# Patient Record
Sex: Male | Born: 1975 | Race: White | Hispanic: No | Marital: Married | Smoking: Never smoker
Health system: Southern US, Community
[De-identification: ages and names within clinical notes are randomized; demographics above are authoritative.]

## PROBLEM LIST (undated history)

## (undated) DIAGNOSIS — M545 Low back pain, unspecified: Secondary | ICD-10-CM

## (undated) DIAGNOSIS — R7989 Other specified abnormal findings of blood chemistry: Secondary | ICD-10-CM

## (undated) DIAGNOSIS — K219 Gastro-esophageal reflux disease without esophagitis: Secondary | ICD-10-CM

## (undated) DIAGNOSIS — A6920 Lyme disease, unspecified: Secondary | ICD-10-CM

## (undated) HISTORY — DX: Other specified abnormal findings of blood chemistry: R79.89

## (undated) HISTORY — DX: Lyme disease, unspecified: A69.20

## (undated) HISTORY — DX: Low back pain: M54.5

## (undated) HISTORY — DX: Low back pain, unspecified: M54.50

## (undated) HISTORY — PX: ANKLE SURGERY: SHX546

---

## 2005-09-16 ENCOUNTER — Emergency Department: Payer: Self-pay | Admitting: Emergency Medicine

## 2006-03-26 ENCOUNTER — Encounter: Admission: RE | Admit: 2006-03-26 | Discharge: 2006-03-26 | Payer: Self-pay | Admitting: Orthopedic Surgery

## 2007-05-04 ENCOUNTER — Ambulatory Visit (HOSPITAL_COMMUNITY): Admission: RE | Admit: 2007-05-04 | Discharge: 2007-05-05 | Payer: Self-pay | Admitting: Orthopedic Surgery

## 2008-03-20 LAB — HM DEXA SCAN: HM Dexa Scan: NORMAL

## 2010-07-22 NOTE — Op Note (Signed)
NAMECANTRELL, LAROUCHE                ACCOUNT NO.:  [id number]   MEDICAL RECORD NO.:  [id number]          PATIENT TYPE:  OIB   LOCATION:  5018                         FACILITY:  [facility]   PHYSICIAN:  Nadara Mustard, MD     DATE OF BIRTH:  [birth year]   DATE OF PROCEDURE:  05/04/2007  DATE OF DISCHARGE:                               OPERATIVE REPORT   PREOPERATIVE DIAGNOSIS:  Subtalar arthritis, right foot, with congenital  calcaneonavicular bar.   POSTOPERATIVE DIAGNOSIS:  Subtalar arthritis, right foot, with  congenital calcaneonavicular bar.   PROCEDURE:  Right foot triple arthrodesis.   SURGEON:  Nadara Mustard, M.D.   ANESTHESIA:  General plus popliteal block.   ESTIMATED BLOOD LOSS:  Minimal.   ANTIBIOTICS:  1 gram of Kefzol.   DRAINS:  None.   COMPLICATIONS:  None.   TOURNIQUET TIME:  Esmarch at the ankle for approximately 56 minutes.   DISPOSITION:  To PACU in stable condition.   INDICATIONS FOR PROCEDURE:  The patient is a 35 year old gentleman with  a congenital subtalar bar.  He underwent subtalar bar resection;  however, the patient had developed significant arthritis in the  talonavicular, calcaneocuboid, and subtalar joint due to his persistent  arthritis.  The patient had pain with activities of daily living and  presents at this time for triple arthrodesis.  CT scan confirmed the  degenerative changes of the talonavicular, calcaneocuboid, and subtalar  joint.  The risks and benefits were as discussed including infection,  neurovascular, persistent pain, need additional surgery.  The patient  states he understands and wished to proceed at this time.   DESCRIPTION OF PROCEDURE:  The patient was brought to OR room 10 after  undergoing a popliteal block.  The patient then underwent a general  anesthetic.  After an adequate level of his anesthesia was obtained, the  patient's right lower extremity was prepped using DuraPrep and draped in  a sterile field.  Collier Flowers  was used to cover all exposed skin.   The previous incision over the sinus tarsi was used.  This was extended  down through the soft tissue.  Fibrous tissue from the subtalar joint  was excised.  The patient underwent excision of the articular cartilage  from the posterior facet of the subtalar joint, within the subtalar  joint, as well as the calcaneocuboid joint.  After debridement,  attention was then focused to the talonavicular joint.  An incision was  made dorsally.  Blunt dissection was carried down.  The vascular bundles  were protected, and the talonavicular joint was also debrided of bone  spurs and articular cartilage.  The foot was reduced with the foot held  in slight valgus and the foot and 90 degrees of ankle dorsiflexion.  A  guidewire was inserted from the talar neck into the calcaneus.  This was  over-drilled, and a 80-mm 7.3 cannulated screw was placed.  Two 3.5-mm  cortical screws were then placed in a crossing fashion to stabilize the  calcaneocuboid joint, and a separate screw 3.5 mm in diameter was placed  across the talonavicular joint.  C-arm fluoroscopy verified reduction in  both AP and lateral planes.  The wounds were irrigated with normal  saline throughout the case.  10 mL of Vitoss bone graft was placed in  the subtalar joint.  The wound was again irrigated.  The extensor brevis  muscle was closed over the sinus tarsi area.  This was again irrigated.  The skin  incisions were closed using a modified vertical mattress  suture with 2-0 nylon.  The wounds were covered with Adaptic orthopedic  sponges, ABD dressing, Kerlix, and Coban.   The patient was extubated and taken to the PACU in stable condition.      Nadara Mustard, MD  Electronically Signed     MVD/MEDQ  D:  05/04/2007  T:  05/05/2007  Job:  [id number]

## 2010-11-28 LAB — CBC
HCT: 44.9
MCHC: 34
RBC: 5.4
RDW: 12.7

## 2013-05-15 ENCOUNTER — Ambulatory Visit: Payer: Self-pay | Admitting: Podiatry

## 2013-05-18 ENCOUNTER — Ambulatory Visit (INDEPENDENT_AMBULATORY_CARE_PROVIDER_SITE_OTHER): Payer: 59 | Admitting: Podiatrist

## 2013-05-18 ENCOUNTER — Ambulatory Visit (INDEPENDENT_AMBULATORY_CARE_PROVIDER_SITE_OTHER): Payer: 59

## 2013-05-18 ENCOUNTER — Encounter: Payer: Self-pay | Admitting: Podiatrist

## 2013-05-18 VITALS — BP 125/79 | HR 62 | Resp 16 | Ht 70.0 in | Wt 255.0 lb

## 2013-05-18 DIAGNOSIS — M722 Plantar fascial fibromatosis: Secondary | ICD-10-CM

## 2013-05-18 DIAGNOSIS — M79609 Pain in unspecified limb: Secondary | ICD-10-CM

## 2013-05-18 DIAGNOSIS — M79673 Pain in unspecified foot: Secondary | ICD-10-CM

## 2013-05-18 MED ORDER — TRIAMCINOLONE ACETONIDE 40 MG/ML IJ SUSP: 20.0000 mg | Freq: Once | INTRAMUSCULAR | Status: DC

## 2013-05-18 MED ORDER — MELOXICAM 15 MG PO TABS: 15.0000 mg | ORAL_TABLET | Freq: Every day | ORAL | Status: DC

## 2013-05-18 NOTE — Progress Notes (Signed)
   Subjective:    Patient ID: Brandis Matsuura, male    DOB: Sep 02, 1975, 38 y.o.   MRN: [id number]  HPI Comments: N tender L left heel D jan 2015 O sudden C same A walking, standing T ibuprofen  Foot Pain      Review of Systems  Constitutional: Negative.   HENT:       Ringing in ears  Eyes: Negative.   Respiratory: Negative.   Cardiovascular: Negative.   Gastrointestinal: Negative.   Endocrine: Negative.   Genitourinary: Negative.   Musculoskeletal:       Joint pain  Difficulty walking   Skin: Negative.   Allergic/Immunologic: Negative.   Neurological: Negative.   Hematological: Negative.   Psychiatric/Behavioral: Negative.        Objective:   Physical Exam  GENERAL APPEARANCE: Alert, conversant. Appropriately groomed. No acute distress.  VASCULAR: Pedal pulses palpable and strong bilateral.  Capillary refill time is immediate to all digits,  Proximal to distal cooling it warm to warm.  Digital hair growth is present bilateral  NEUROLOGIC: sensation is intact epicritically and protectively to 5.07 monofilament at 5/5 sites bilateral.  Light touch is intact bilateral, vibratory sensation intact bilateral, achilles tendon reflex is intact bilateral.  Negative Tinel sign is elicited MUSCULOSKELETAL: Pain on palpation plantar medial aspect left foot  at insertion of plantar fascia on the medial calcaneal tubercle. Inflammation at the insertion of the plantar fascia is present. Pes planus foot type is seen bilateral.  Subtalar joint right foot has been previously fused courtesy of Dr. Sharol Given.  He is having no problem DERMATOLOGIC: peeling skin of the bilateral feet is noted.  Area of thickness on bilateral heels also present.       Assessment & Plan:  Plantar fasciitis left  Plan: A steroid injection was recommended into the left heel and the patient agreed.  As was carried out under sterile technique with Kenalog and Marcaine plain. A plantar fascial brace was dispensed as  well as a prescription for meloxicam. I will see him back in 4 weeks for followup. He will call if he has problems prior that visit.

## 2013-05-18 NOTE — Patient Instructions (Signed)

## 2013-05-18 NOTE — Progress Notes (Deleted)
Plantar fasciitis left, inject, mobic, plantar fascia brace back in 4 weeks duda fused right subtalar joint

## 2013-06-15 ENCOUNTER — Ambulatory Visit (INDEPENDENT_AMBULATORY_CARE_PROVIDER_SITE_OTHER): Payer: 59 | Admitting: Podiatrist

## 2013-06-15 VITALS — Resp 16 | Ht 70.0 in | Wt 245.0 lb

## 2013-06-15 DIAGNOSIS — M722 Plantar fascial fibromatosis: Secondary | ICD-10-CM

## 2013-06-15 NOTE — Patient Instructions (Signed)

## 2013-06-20 NOTE — Progress Notes (Signed)
Chief Complaint  Patient presents with  . Follow-up    its alright, it still hurts. the brace does help     HPI: Patient is 38 y.o. male who presents today for followup of plantar fasciitis. He states that the foot feels okay and that the brace is helping.  Physical examination is unchanged  APPEARANCE: Alert, conversant. Appropriately groomed. No acute distress.  VASCULAR: Pedal pulses palpable and strong bilateral. Capillary refill time is immediate to all digits, Proximal to distal cooling it warm to warm. Digital hair growth is present bilateral  NEUROLOGIC: sensation is intact epicritically and protectively to 5.07 monofilament at 5/5 sites bilateral. Light touch is intact bilateral, vibratory sensation intact bilateral, achilles tendon reflex is intact bilateral. Negative Tinel sign is elicited  MUSCULOSKELETAL: Pain on palpation plantar medial aspect left foot at insertion of plantar fascia on the medial calcaneal tubercle. Inflammation at the insertion of the plantar fascia is present. Pes planus foot type is seen bilateral. Subtalar joint right foot has been previously fused courtesy of Dr. Sharol Given. He is having no problem in this area DERMATOLOGIC: peeling skin of the bilateral feet is noted. Area of thickness on bilateral heels also present.   Assessment: Plantar fasciitis left  Plan: A second injection was carried out under sterile technique at today's visit. Recommended continued stretching exercises as well as good supportive shoes and we discussed inserts as well. I'll see him back for recheck.

## 2013-07-06 ENCOUNTER — Ambulatory Visit: Payer: 59 | Admitting: Podiatrist

## 2013-07-12 ENCOUNTER — Ambulatory Visit: Payer: Self-pay | Admitting: Family Medicine

## 2013-07-20 ENCOUNTER — Ambulatory Visit (INDEPENDENT_AMBULATORY_CARE_PROVIDER_SITE_OTHER): Payer: 59 | Admitting: Podiatrist

## 2013-07-20 VITALS — BP 127/73 | HR 69 | Resp 16

## 2013-07-20 DIAGNOSIS — M722 Plantar fascial fibromatosis: Secondary | ICD-10-CM

## 2013-07-20 NOTE — Progress Notes (Signed)
HPI: Patient is 38 y.o. male who presents today for followup of plantar fasciitis. He states that the foot feels okay and he has been using some otc inserts that are helping.  He relates the brace has started to hurt the foot but overall states the pain is slowly improving.  Physical examination is unchanged  APPEARANCE: Alert, conversant. Appropriately groomed. No acute distress.  VASCULAR: Pedal pulses palpable and strong bilateral. Capillary refill time is immediate to all digits, Proximal to distal cooling it warm to warm. Digital hair growth is present bilateral  NEUROLOGIC: sensation is intact epicritically and protectively to 5.07 monofilament at 5/5 sites bilateral. Light touch is intact bilateral, vibratory sensation intact bilateral, achilles tendon reflex is intact bilateral. Negative Tinel sign is elicited  MUSCULOSKELETAL: Pain on palpation plantar medial aspect left foot at insertion of plantar fascia on the medial calcaneal tubercle. Decreased inflammation at the insertion of the plantar fascia is noted. Pes planus foot type is seen bilateral. Subtalar joint right foot has been previously fused courtesy of Dr. Sharol Given. He is having no problem in this area  DERMATOLOGIC: peeling skin of the bilateral feet is noted. Area of thickness on bilateral heels also present.   Assessment: Plantar fasciitis left  Plan:  Discussed orthotics as the next step in treatment. He would like to try some over-the-counter orthotics first and therefore I recommended the power step inserts. Recommended continuing his stretching, wearing his good knee about shoes, and anti-inflammatory use when necessary. We did not inject the heel at today's visit. If the over-the-counter inserts are not beneficial we'll consider custom. He will call if he would like to consider these.

## 2013-07-20 NOTE — Patient Instructions (Signed)

## 2014-05-14 ENCOUNTER — Encounter: Payer: Self-pay | Admitting: Gastroenterology

## 2014-06-26 ENCOUNTER — Ambulatory Visit: Payer: Self-pay | Admitting: Gastroenterology

## 2014-09-19 ENCOUNTER — Ambulatory Visit (INDEPENDENT_AMBULATORY_CARE_PROVIDER_SITE_OTHER): Payer: 59 | Admitting: Family Medicine

## 2014-09-19 ENCOUNTER — Encounter: Payer: Self-pay | Admitting: Family Medicine

## 2014-09-19 ENCOUNTER — Other Ambulatory Visit: Payer: Self-pay | Admitting: Family Medicine

## 2014-09-19 VITALS — BP 122/84 | HR 74 | Temp 98.3°F | Resp 18 | Ht 70.0 in | Wt 249.5 lb

## 2014-09-19 DIAGNOSIS — G4733 Obstructive sleep apnea (adult) (pediatric): Secondary | ICD-10-CM | POA: Diagnosis not present

## 2014-09-19 DIAGNOSIS — E66811 Obesity, class 1: Secondary | ICD-10-CM | POA: Insufficient documentation

## 2014-09-19 DIAGNOSIS — Z0000 Encounter for general adult medical examination without abnormal findings: Secondary | ICD-10-CM | POA: Diagnosis not present

## 2014-09-19 DIAGNOSIS — E669 Obesity, unspecified: Secondary | ICD-10-CM | POA: Diagnosis not present

## 2014-09-19 DIAGNOSIS — E291 Testicular hypofunction: Secondary | ICD-10-CM | POA: Insufficient documentation

## 2014-09-19 DIAGNOSIS — E039 Hypothyroidism, unspecified: Secondary | ICD-10-CM | POA: Insufficient documentation

## 2014-09-19 NOTE — Progress Notes (Signed)
Name: Cameron Hood   MRN: [id number]    DOB: 26-Oct-1975   Date:09/19/2014       Progress Note  Subjective  Chief Complaint  Chief Complaint  Patient presents with  . Annual Exam    HPI   39 year old male presents for annual  H&P.  Obstructive sleep apnea  Patient has recurring and worsening history of nocturnal snoring as well as awakening from sleep gasping at times. His wife has noted snoring as well. He often awakens with headache and often finds himself nodding when sitting for long periods of time. There's also been a problem with peripheral edema weight gain and hypertension   Hypogonadism  Patient is currently under the care of a urologist and is on AndroGel 4 pumps daily. He states this lasts total testosterone was 200 range but his free testosterone was in the normal range.    Past Medical History  Diagnosis Date  . Low testosterone     History  Substance Use Topics  . Smoking status: Never Smoker   . Smokeless tobacco: Not on file  . Alcohol Use: 0.0 oz/week    0 Standard drinks or equivalent per week     Current outpatient prescriptions:  .  Testosterone (ANDROGEL PUMP) 20.25 MG/ACT (1.62%) GEL, Place onto the skin daily., Disp: , Rfl:   Current facility-administered medications:  .  triamcinolone acetonide (KENALOG-40) injection 20 mg, 20 mg, Other, Once, Bronson Ing, DPM  Allergies  Allergen Reactions  . Mobic [Meloxicam] Other (See Comments)    Heart race    Review of Systems  Constitutional: Positive for malaise/fatigue. Negative for fever, chills and weight loss.       Obesity  HENT: Negative for congestion, hearing loss, sore throat and tinnitus.        Snoring  Eyes: Negative for blurred vision, double vision and redness.  Respiratory: Positive for shortness of breath. Negative for cough and hemoptysis.        Sometimes awakens at night short of breath and gasping. He also has daytime somnolence and headache in the a.m.   Cardiovascular: Negative for chest pain, palpitations, orthopnea, claudication and leg swelling.  Gastrointestinal: Negative for heartburn, nausea, vomiting, diarrhea, constipation and blood in stool.  Genitourinary: Negative for dysuria, urgency, frequency and hematuria.       Hypogonadism Decreased libido  Musculoskeletal: Negative for myalgias, back pain, joint pain, falls and neck pain.  Skin: Negative for itching.  Neurological: Negative for dizziness, tingling, tremors, focal weakness, seizures, loss of consciousness, weakness and headaches.  Endo/Heme/Allergies: Does not bruise/bleed easily.  Psychiatric/Behavioral: Negative for depression and substance abuse. The patient has insomnia. The patient is not nervous/anxious.      Objective  Filed Vitals:   09/19/14 0952  BP: 122/84  Pulse: 74  Temp: 98.3 F (36.8 C)  TempSrc: Oral  Resp: 18  Height: 5' 10"  (1.778 m)  Weight: 249 lb 8 oz (113.172 kg)  SpO2: 98%     Physical Exam  Constitutional: He is oriented to person, place, and time and well-developed, well-nourished, and in no distress.  Obese  HENT:  Head: Normocephalic.  Eyes: EOM are normal. Pupils are equal, round, and reactive to light.  Neck: Normal range of motion. Neck supple. No thyromegaly present.  Cardiovascular: Normal rate, regular rhythm and normal heart sounds.   No murmur heard. Pulmonary/Chest: Effort normal and breath sounds normal. No respiratory distress. He has no wheezes.  Abdominal: Soft. Bowel sounds are normal.  Genitourinary: Discharge  found.  Musculoskeletal: Normal range of motion. He exhibits no edema.  Lymphadenopathy:    He has no cervical adenopathy.  Neurological: He is alert and oriented to person, place, and time. No cranial nerve deficit. Gait normal. Coordination normal.  Skin: Skin is warm and dry. No rash noted.  Psychiatric: Affect and judgment normal.      Assessment & Plan  1. Annual physical exam  -  Comprehensive metabolic panel - Lipid panel - CBC with Differential/Platelet - POC Hemoccult Bld/Stl (3-Cd Home Screen); Future  2. OSA (obstructive sleep apnea)  - Ambulatory referral to Sleep Studies  3. Obesity  h/o  4. Hypogonadism in male Per endo

## 2014-09-19 NOTE — Patient Instructions (Signed)
Obesity Obesity is defined as having too much total body fat and a body mass index (BMI) of 30 or more. BMI is an estimate of body fat and is calculated from your height and weight. Obesity happens when you consume more calories than you can burn by exercising or performing daily physical tasks. Prolonged obesity can cause major illnesses or emergencies, such as:   Stroke.  Heart disease.  Diabetes.  Cancer.  Arthritis.  High blood pressure (hypertension).  High cholesterol.  Sleep apnea.  Erectile dysfunction.  Infertility problems. CAUSES   Regularly eating unhealthy foods.  Physical inactivity.  Certain disorders, such as an underactive thyroid (hypothyroidism), Cushing's syndrome, and polycystic ovarian syndrome.  Certain medicines, such as steroids, some depression medicines, and antipsychotics.  Genetics.  Lack of sleep. DIAGNOSIS  A health care provider can diagnose obesity after calculating your BMI. Obesity will be diagnosed if your BMI is 30 or higher.  There are other methods of measuring obesity levels. Some other methods include measuring your skinfold thickness, your waist circumference, and comparing your hip circumference to your waist circumference. TREATMENT  A healthy treatment program includes some or all of the following:  Long-term dietary changes.  Exercise and physical activity.  Behavioral and lifestyle changes.  Medicine only under the supervision of your health care provider. Medicines may help, but only if they are used with diet and exercise programs. An unhealthy treatment program includes:  Fasting.  Fad diets.  Supplements and drugs. These choices do not succeed in long-term weight control.  HOME CARE INSTRUCTIONS   Exercise and perform physical activity as directed by your health care provider. To increase physical activity, try the following:  Use stairs instead of elevators.  Park farther away from store  entrances.  Garden, bike, or walk instead of watching television or using the computer.  Eat healthy, low-calorie foods and drinks on a regular basis. Eat more fruits and vegetables. Use low-calorie cookbooks or take healthy cooking classes.  Limit fast food, sweets, and processed snack foods.  Eat smaller portions.  Keep a daily journal of everything you eat. There are many free websites to help you with this. It may be helpful to measure your foods so you can determine if you are eating the correct portion sizes.  Avoid drinking alcohol. Drink more water and drinks without calories.  Take vitamins and supplements only as recommended by your health care provider.  Weight-loss support groups, Tax adviser, counselors, and stress reduction education can also be very helpful. SEEK IMMEDIATE MEDICAL CARE IF:  You have chest pain or tightness.  You have trouble breathing or feel short of breath.  You have weakness or leg numbness.  You feel confused or have trouble talking.  You have sudden changes in your vision. MAKE SURE YOU:  Understand these instructions.  Will watch your condition.  Will get help right away if you are not doing well or get worse. Document Released: 04/02/2004 Document Revised: 07/10/2013 Document Reviewed: 04/01/2011 [facility] Patient Information 2015 [place], [place]. This information is not intended to replace advice given to you by your health care provider. Make sure you discuss any questions you have with your health care provider.

## 2014-09-20 LAB — CBC WITH DIFFERENTIAL/PLATELET
Basophils Absolute: 0.1 10*3/uL (ref 0.0–0.2)
Basos: 1 %
EOS (ABSOLUTE): 0.1 10*3/uL (ref 0.0–0.4)
EOS: 2 %
HEMATOCRIT: 46.7 % (ref 37.5–51.0)
Hemoglobin: 15.9 g/dL (ref 12.6–17.7)
Immature Grans (Abs): 0 10*3/uL (ref 0.0–0.1)
Immature Granulocytes: 0 %
LYMPHS ABS: 1.5 10*3/uL (ref 0.7–3.1)
LYMPHS: 26 %
MCH: 28.4 pg (ref 26.6–33.0)
MCHC: 34 g/dL (ref 31.5–35.7)
MCV: 84 fL (ref 79–97)
MONOCYTES: 8 %
Monocytes Absolute: 0.4 10*3/uL (ref 0.1–0.9)
Neutrophils Absolute: 3.7 10*3/uL (ref 1.4–7.0)
Neutrophils: 63 %
PLATELETS: 287 10*3/uL (ref 150–379)
RBC: 5.59 x10E6/uL (ref 4.14–5.80)
RDW: 14.1 % (ref 12.3–15.4)
WBC: 5.8 10*3/uL (ref 3.4–10.8)

## 2014-09-20 LAB — COMPREHENSIVE METABOLIC PANEL
ALBUMIN: 4.3 g/dL (ref 3.5–5.5)
ALT: 20 IU/L (ref 0–44)
AST: 21 IU/L (ref 0–40)
Albumin/Globulin Ratio: 1.7 (ref 1.1–2.5)
Alkaline Phosphatase: 84 IU/L (ref 39–117)
BUN/Creatinine Ratio: 16 (ref 9–20)
BUN: 13 mg/dL (ref 6–24)
Bilirubin Total: 0.6 mg/dL (ref 0.0–1.2)
CHLORIDE: 101 mmol/L (ref 97–108)
CO2: 24 mmol/L (ref 18–29)
CREATININE: 0.79 mg/dL (ref 0.76–1.27)
Calcium: 9.3 mg/dL (ref 8.7–10.2)
GFR, EST AFRICAN AMERICAN: 130 mL/min/{1.73_m2} (ref >59)
GFR, EST NON AFRICAN AMERICAN: 112 mL/min/{1.73_m2} (ref >59)
GLOBULIN, TOTAL: 2.5 g/dL (ref 1.5–4.5)
Glucose: 93 mg/dL (ref 65–99)
Potassium: 4.4 mmol/L (ref 3.5–5.2)
SODIUM: 140 mmol/L (ref 134–144)
TOTAL PROTEIN: 6.8 g/dL (ref 6.0–8.5)

## 2014-09-20 LAB — LIPID PANEL
CHOL/HDL RATIO: 3.9 ratio (ref 0.0–5.0)
CHOLESTEROL TOTAL: 138 mg/dL (ref 100–199)
HDL: 35 mg/dL — AB (ref >39)
LDL Calculated: 74 mg/dL (ref 0–99)
TRIGLYCERIDES: 145 mg/dL (ref 0–149)
VLDL CHOLESTEROL CAL: 29 mg/dL (ref 5–40)

## 2014-10-31 ENCOUNTER — Encounter: Payer: Self-pay | Admitting: Family Medicine

## 2014-10-31 ENCOUNTER — Ambulatory Visit (INDEPENDENT_AMBULATORY_CARE_PROVIDER_SITE_OTHER): Payer: 59 | Admitting: Family Medicine

## 2014-10-31 VITALS — BP 132/80 | HR 85 | Temp 99.8°F | Resp 18 | Ht 70.0 in | Wt 245.9 lb

## 2014-10-31 DIAGNOSIS — Z8619 Personal history of other infectious and parasitic diseases: Secondary | ICD-10-CM

## 2014-10-31 DIAGNOSIS — N410 Acute prostatitis: Secondary | ICD-10-CM | POA: Diagnosis not present

## 2014-10-31 DIAGNOSIS — R509 Fever, unspecified: Secondary | ICD-10-CM

## 2014-10-31 DIAGNOSIS — A6920 Lyme disease, unspecified: Secondary | ICD-10-CM | POA: Diagnosis not present

## 2014-10-31 HISTORY — DX: Personal history of other infectious and parasitic diseases: Z86.19

## 2014-10-31 LAB — POCT URINALYSIS DIPSTICK
Bilirubin, UA: NEGATIVE
Glucose, UA: NEGATIVE
KETONES UA: NEGATIVE
LEUKOCYTES UA: NEGATIVE
Nitrite, UA: NEGATIVE
PH UA: 5
SPEC GRAV UA: 1.02
Urobilinogen, UA: 0.2

## 2014-10-31 LAB — POCT RAPID INFLUENZA A&B: SALIVARY CORTISOL BASELINE: NEGATIVE

## 2014-10-31 MED ORDER — DOXYCYCLINE HYCLATE 100 MG PO CAPS: 100.0000 mg | ORAL_CAPSULE | Freq: Two times a day (BID) | ORAL | Status: DC

## 2014-10-31 NOTE — Progress Notes (Signed)
Name: Cameron Hood   MRN: [id number]    DOB: 1975-11-25   Date:10/31/2014       Progress Note  Subjective  Chief Complaint  Chief Complaint  Patient presents with  . Fever    102, bodyaches for 4 days    HPI  Fever  Patient presents with a four-day history of fever or chills myalgias. This been no nausea vomiting diarrhea rash. He has had some mild myalgias and joint pains as well as mild headache. There's been no recent known tick bite. However he has a semi-remote history of Lyme's disease he states his symptoms are somewhat similar here at the beginning we did not find a tick bite this time. He is out of doors quite a bit. There are no urinary symptoms currently    Past Medical History  Diagnosis Date  . Low testosterone     Social History  Substance Use Topics  . Smoking status: Never Smoker   . Smokeless tobacco: Not on file  . Alcohol Use: 0.0 oz/week    0 Standard drinks or equivalent per week     Current outpatient prescriptions:  .  Testosterone (ANDROGEL PUMP) 20.25 MG/ACT (1.62%) GEL, Place onto the skin daily., Disp: , Rfl:   Allergies  Allergen Reactions  . Mobic [Meloxicam] Other (See Comments)    Heart race    Review of Systems  Constitutional: Positive for fever, chills and malaise/fatigue. Negative for weight loss.  HENT: Negative for congestion, ear pain, hearing loss, sore throat and tinnitus.   Eyes: Negative for blurred vision, double vision and redness.  Respiratory: Positive for cough. Negative for hemoptysis and shortness of breath.   Cardiovascular: Negative for chest pain, palpitations, orthopnea, claudication and leg swelling.  Gastrointestinal: Negative for heartburn, nausea, vomiting, diarrhea, constipation and blood in stool.  Genitourinary: Positive for dysuria. Negative for urgency, frequency and hematuria.  Musculoskeletal: Positive for myalgias and joint pain. Negative for back pain, falls and neck pain.  Skin: Negative for  itching.  Neurological: Positive for headaches. Negative for dizziness, tingling, tremors, focal weakness, seizures, loss of consciousness and weakness.  Endo/Heme/Allergies: Does not bruise/bleed easily.  Psychiatric/Behavioral: Negative for depression and substance abuse. The patient is not nervous/anxious and does not have insomnia.      Objective  Filed Vitals:   10/31/14 0755  BP: 132/80  Pulse: 85  Temp: 99.8 F (37.7 C)  TempSrc: Oral  Resp: 18  Height: 5' 10"  (1.778 m)  Weight: 245 lb 14.4 oz (111.54 kg)  SpO2: 97%     Physical Exam  Constitutional: He is oriented to person, place, and time and well-developed, well-nourished, and in no distress.  HENT:  Head: Normocephalic.  Eyes: EOM are normal. Pupils are equal, round, and reactive to light.  Neck: Normal range of motion. Neck supple. No thyromegaly present.  Cardiovascular: Normal rate, regular rhythm and normal heart sounds.   No murmur heard. Pulmonary/Chest: Effort normal and breath sounds normal. No respiratory distress. He has no wheezes.  Abdominal: Soft. Bowel sounds are normal.  Genitourinary:  Prostate is slightly warm and tender to palpation  Musculoskeletal: Normal range of motion. He exhibits no edema.  Lymphadenopathy:    He has no cervical adenopathy.  Neurological: He is alert and oriented to person, place, and time. No cranial nerve deficit. Gait normal. Coordination normal.  Skin: Skin is warm and dry. No rash noted.  Psychiatric: Affect and judgment normal.      Assessment & Plan  1.  Fever, unspecified fever cause Possible causes include mild viral illness, the mild prostatitis noted below, recurrence of Lyme disease  - POCT Rapid Influenza A&B; Standing - POCT urinalysis dipstick - CBC - Comprehensive metabolic panel - TSH  2. Acute prostatitis Mild with hematuria  - doxycycline (VIBRAMYCIN) 100 MG capsule; Take 1 capsule (100 mg total) by mouth 2 (two) times daily.  Dispense: 42  capsule; 3. Lyme disease With history and similar symptomatology Refill: 0  - doxycycline (VIBRAMYCIN) 100 MG capsule; Take 1 capsule (100 mg total) by mouth 2 (two) times daily.  Dispense: 42 capsule; Refill: 0

## 2014-11-01 LAB — COMPREHENSIVE METABOLIC PANEL
ALBUMIN: 4.2 g/dL (ref 3.5–5.5)
ALT: 26 IU/L (ref 0–44)
AST: 22 IU/L (ref 0–40)
Albumin/Globulin Ratio: 1.6 (ref 1.1–2.5)
Alkaline Phosphatase: 82 IU/L (ref 39–117)
BUN / CREAT RATIO: 14 (ref 9–20)
BUN: 13 mg/dL (ref 6–24)
Bilirubin Total: 0.7 mg/dL (ref 0.0–1.2)
CALCIUM: 9.4 mg/dL (ref 8.7–10.2)
CO2: 25 mmol/L (ref 18–29)
CREATININE: 0.93 mg/dL (ref 0.76–1.27)
Chloride: 100 mmol/L (ref 97–108)
GFR, EST AFRICAN AMERICAN: 117 mL/min/{1.73_m2} (ref >59)
GFR, EST NON AFRICAN AMERICAN: 102 mL/min/{1.73_m2} (ref >59)
GLOBULIN, TOTAL: 2.7 g/dL (ref 1.5–4.5)
Glucose: 103 mg/dL — ABNORMAL HIGH (ref 65–99)
Potassium: 4.9 mmol/L (ref 3.5–5.2)
SODIUM: 138 mmol/L (ref 134–144)
Total Protein: 6.9 g/dL (ref 6.0–8.5)

## 2014-11-01 LAB — CBC
HEMATOCRIT: 46.5 % (ref 37.5–51.0)
HEMOGLOBIN: 15.7 g/dL (ref 12.6–17.7)
MCH: 27.7 pg (ref 26.6–33.0)
MCHC: 33.8 g/dL (ref 31.5–35.7)
MCV: 82 fL (ref 79–97)
Platelets: 271 10*3/uL (ref 150–379)
RBC: 5.67 x10E6/uL (ref 4.14–5.80)
RDW: 13.8 % (ref 12.3–15.4)
WBC: 5.9 10*3/uL (ref 3.4–10.8)

## 2014-11-01 LAB — TSH: TSH: 2.11 u[IU]/mL (ref 0.450–4.500)

## 2014-11-21 ENCOUNTER — Ambulatory Visit (INDEPENDENT_AMBULATORY_CARE_PROVIDER_SITE_OTHER): Payer: 59 | Admitting: Family Medicine

## 2014-11-21 ENCOUNTER — Encounter: Payer: Self-pay | Admitting: Family Medicine

## 2014-11-21 VITALS — BP 130/80 | HR 75 | Temp 97.9°F | Resp 18 | Ht 70.0 in | Wt 239.9 lb

## 2014-11-21 DIAGNOSIS — A6920 Lyme disease, unspecified: Secondary | ICD-10-CM

## 2014-11-21 DIAGNOSIS — E669 Obesity, unspecified: Secondary | ICD-10-CM

## 2014-11-21 NOTE — Progress Notes (Signed)
Name: Cameron Hood   MRN: [id number]    DOB: 05-07-1975   Date:11/21/2014       Progress Note  Subjective  Chief Complaint  Chief Complaint  Patient presents with  . OTHER    Discuss BMI on wellness screening  . Lyme Disease    follow up for fever     HPI  Next problem possible Lyme disease were optimized spotted fever  Patient has completed nearly three-week history of Vibramycin. He has fewer problems with fever chills myalgias and the number was rash. There was never a definite history of a tick bite. He has had Lyme disease in the past he states his symptomatology was quite similar.  Obesity patient has metabolic screen obtained from his place of woman. 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  Past Medical History  Diagnosis Date  . Low testosterone     Social History  Substance Use Topics  . Smoking status: Never Smoker   . Smokeless tobacco: Not on file  . Alcohol Use: 0.0 oz/week    0 Standard drinks or equivalent per week     Current outpatient prescriptions:  .  Testosterone (ANDROGEL PUMP) 20.25 MG/ACT (1.62%) GEL, Place onto the skin daily., Disp: , Rfl:   Allergies  Allergen Reactions  . Mobic [Meloxicam] Other (See Comments)    Heart race    Review of Systems  Constitutional: Positive for fever, chills, weight loss and malaise/fatigue.  HENT: Positive for congestion and sore throat.   Respiratory: Negative.   Cardiovascular: Negative.   Gastrointestinal: Negative.   Genitourinary: Negative.   Musculoskeletal: Positive for myalgias and joint pain.  Neurological: Positive for headaches. Negative for focal weakness.     Objective  Filed Vitals:   11/21/14 0740  BP: 130/80  Pulse: 75  Temp: 97.9 F (36.6 C)  TempSrc: Oral  Resp: 18  Height: 5' 10"  (1.778 m)  Weight: 239 lb 14.4 oz (108.818 kg)  SpO2: 98%     Physical Exam   Constitutional: He is oriented to person, place, and time and well-developed, well-nourished, and in no distress.  Obese male who is no acute distress  HENT:  Head: Normocephalic.  Eyes: EOM are normal. Pupils are equal, round, and reactive to light.  Neck: Normal range of motion. Neck supple. No thyromegaly present.  Cardiovascular: Normal rate, regular rhythm and normal heart sounds.   No murmur heard. Pulmonary/Chest: Effort normal and breath sounds normal. No respiratory distress. He has no wheezes.  Musculoskeletal: Normal range of motion. He exhibits no edema.  Lymphadenopathy:    He has no cervical adenopathy.  Neurological: He is alert and oriented to person, place, and time. No cranial nerve deficit. Gait normal. Coordination normal.  Skin: Skin is warm and dry. No rash noted.  Psychiatric: Affect and judgment normal.      Assessment & Plan 1. 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 Obesity Referral - Amb ref to Medical Nutrition Therapy-MNT

## 2014-11-23 ENCOUNTER — Telehealth: Payer: Self-pay | Admitting: Emergency Medicine

## 2014-11-23 LAB — LYME DISEASE, WESTERN BLOT
IGG P39 AB.: ABSENT
IGG P58 AB.: ABSENT
IGG P66 AB.: ABSENT
IGG P93 AB.: ABSENT
IGM P39 AB.: ABSENT
IgG P18 Ab.: ABSENT
IgG P23 Ab.: ABSENT
IgG P28 Ab.: ABSENT
IgG P30 Ab.: ABSENT
IgG P45 Ab.: ABSENT
IgM P41 Ab.: ABSENT
LYME IGM WB: NEGATIVE
Lyme IgG Wb: NEGATIVE

## 2014-11-23 LAB — ROCKY MTN SPOTTED FVR AB, IGG-BLOOD: RMSF IGG: UNDETERMINED

## 2014-11-23 LAB — RMSF, IGG, IFA

## 2014-11-23 NOTE — Telephone Encounter (Signed)
Patient notified

## 2014-11-27 ENCOUNTER — Other Ambulatory Visit: Payer: Self-pay | Admitting: Emergency Medicine

## 2014-11-27 MED ORDER — DOXYCYCLINE HYCLATE 100 MG PO CAPS: 100.0000 mg | ORAL_CAPSULE | Freq: Two times a day (BID) | ORAL | Status: DC

## 2014-11-29 NOTE — Telephone Encounter (Signed)
Patient notified of results and medication Vibramycin called in

## 2014-12-11 ENCOUNTER — Encounter: Payer: 59 | Attending: Family Medicine | Admitting: Dietician

## 2014-12-11 VITALS — Ht 70.0 in | Wt 241.2 lb

## 2014-12-11 DIAGNOSIS — E669 Obesity, unspecified: Secondary | ICD-10-CM | POA: Insufficient documentation

## 2014-12-11 NOTE — Patient Instructions (Signed)
   Choose grilled meats rather than fried when eating out, and add in vegetables as much as possible.   Work to [employer] portions at supper, especially of starchy foods, and increase vegetable portions.   Eat a small meal in the mornings, can try egg mcmuffin or light breakfast sandwich. Or [place] yogurt with small amount of whole grain granola.   Work to include a fruit or vegetable with every meal; ideally 5 or more servings daily.   Consider options for including regular exercise.

## 2014-12-11 NOTE — Progress Notes (Signed)
Medical Nutrition Therapy: Visit start time: 0900  end time: 1000  Assessment:  Diagnosis: obesity Past medical history: lyme disease Psychosocial issues/ stress concerns: patient reports moderate level of stress; states he works through the stress Preferred learning method:  . Visual  Current weight: 241.2lbs  Height: 5'10" Medications, supplements: reviewed list in chart with patient Progress and evaluation: Some weight loss, possibly due to a recurrence of Lyme disease recently.          Tried dieting a few times in the past, with only short-term success.           Patient reports his wife will be having weight loss surgery on 01/08/15, so meals at home will likely be changing.   Physical activity: no structured exercise, job involves some physical work and some walking.   Dietary Intake:  Usual eating pattern includes 2-3 meals and 1-2 snacks per day. Dining out frequency: 9 meals per week.  Breakfast: often none due to time, or picks up fast food Snack: occasionally cheezits or chips Lunch: usually out, [employer] next door to work Snack: same as am Supper: 10/3 fried chicken, mashed potatoes, peas; spaghetti, hamburger steak.  Snack: occasionally sugar free popsicle Beverages: water, mostly crystal light fruit punch, occasional sweet tea  Nutrition Care Education: Topics covered: weight loss Basic nutrition: basic food groups, appropriate nutrient balance, appropriate meal and snack schedule, general nutrition guidelines    Weight control: behavioral changes for weight loss, appropriate food portions, importance of low sugar and low fat choices, 1800kcal meal plan  Advanced nutrition:   dining out-- low fat choices, adding vegetables or fruits  Other lifestyle changes:  Regular exercise  Nutritional Diagnosis:  [place]-3.3 Overweight/obesity As related to excess caloric intake.  As evidenced by patient report, high BMI.  Intervention: Instruction as noted above.   Patient will  work on portion control, consistent breakfast meals, more meals at home and/or better restaurant choices.   He plans to begin some regular exercise.  Education Materials given:  . Food lists/ Planning A Balanced Meal: 1800kcal meal plan . Sample meal pattern/ menus: Quick and Healthy Meal Ideas . Goals/ instructions  Learner/ who was taught:  . Patient   Level of understanding: Marland Kitchen Verbalizes/ demonstrates competency  Demonstrated degree of understanding via:   Teach back Learning barriers: . None  Willingness to learn/ readiness for change: . Acceptance, ready for change  Monitoring and Evaluation:  Dietary intake, exercise, and body weight      follow up: 01/11/15

## 2015-01-11 ENCOUNTER — Ambulatory Visit: Payer: 59 | Admitting: Dietician

## 2015-01-21 ENCOUNTER — Encounter: Payer: 59 | Attending: Family Medicine | Admitting: Dietician

## 2015-01-21 VITALS — Ht 70.0 in | Wt 226.5 lb

## 2015-01-21 DIAGNOSIS — E669 Obesity, unspecified: Secondary | ICD-10-CM | POA: Diagnosis not present

## 2015-01-21 NOTE — Patient Instructions (Signed)
   Include a fruit for some snacks, along with a small handful (1/4 cup) of nuts.   Take some vegetables with lunches to work, such as raw broccoli.   Plan to begin some regular exercise.

## 2015-01-21 NOTE — Progress Notes (Signed)
Medical Nutrition Therapy: Visit start time: 0930  end time: 1000  Assessment:  Diagnosis: obesity Medical history changes: no changes Psychosocial issues/ stress concerns: none  Current weight: 226.5lbs  Height: 5'10" Medications, supplement changes: no changes per patient  Progress and evaluation: Patient reports following 1800kcal plan strictly for about 3 weeks, but has since liberalized his diet somewhat with some extra foods or larger portions.         Household eating pattern has also changed, with wife having bariatric surgery about 2 weeks ago.            Physical activity: on the job activity  Dietary Intake:  Usual eating pattern includes 3 meals and 1-2 snacks per day. Dining out frequency: fewer meals per week.  Breakfast: carnation breakfast drink, or steel-cut instant oatmeal sometimes with extra protein.  Snack: occasionally 1oz cashews or white cheddar cheezits  Lunch: [place] sandwich and white cheddar cheezits 1oz.   Snack: same as am or none Supper: sometimes [place] sandwich (white wheat bread) small amount of mayo; smaller plate of food when home, no second portions Snack: sugar free popsicle Beverages: sugar free lemonade, orangeade  Nutrition Care Education: Topics covered: weight management Basic nutrition: appropriate nutrient balance  Weight control: identifying healthy weight, determining reasonable weight goal, behavioral changes for weight loss, increasing fruit and vegetables to better manage hunger Other lifestyle changes:  Dealing with setbacks  Nutritional Diagnosis:  [place]-3.3 Overweight/obesity As related to history of excess caloric intake.  As evidenced by patient report, high BMI.  Intervention: Discussion as noted above.    Updated goals with patient input.    Commended patient for successes thus far.    Patient declined scheduling another follow-up at this time.   Education Materials given:  Marland Kitchen Goals/ instructions . how to increase vegetables  and fruits  Learner/ who was taught:  . Patient   Level of understanding: Marland Kitchen Verbalizes/ demonstrates competency   Demonstrated degree of understanding via:   Teach back Learning barriers: . None  Willingness to learn/ readiness for change: . Eager, change in progress   Monitoring and Evaluation:  Dietary intake, exercise, and body weight      follow up: prn

## 2015-09-23 ENCOUNTER — Telehealth: Payer: Self-pay | Admitting: Family Medicine

## 2015-09-23 NOTE — Telephone Encounter (Signed)
Patient has annual physical scheduled for 11/06/15. He is asking that you please give him a lab order so the results will be here by his appointment time. He is needing the results here because he has a wellness form that is going to require those lab numbers.

## 2015-09-24 ENCOUNTER — Ambulatory Visit: Payer: 59 | Admitting: Family Medicine

## 2015-09-24 NOTE — Telephone Encounter (Signed)
Patient states his wife works at [employer] so please order through them and patient would like normal labs to be done for his Wellness. Patient also states he does Testerone supplements to please check this and his PSA with the other labs you would normally order. Thanks.

## 2015-09-24 NOTE — Telephone Encounter (Signed)
Does he work for [employer]? What labs does he need?

## 2015-10-01 ENCOUNTER — Ambulatory Visit: Payer: 59 | Admitting: Family Medicine

## 2015-11-06 ENCOUNTER — Ambulatory Visit: Payer: 59 | Admitting: Family Medicine

## 2015-12-04 ENCOUNTER — Ambulatory Visit (INDEPENDENT_AMBULATORY_CARE_PROVIDER_SITE_OTHER): Payer: 59 | Admitting: Family Medicine

## 2015-12-04 ENCOUNTER — Encounter: Payer: Self-pay | Admitting: Family Medicine

## 2015-12-04 VITALS — BP 120/68 | HR 91 | Temp 98.6°F | Resp 16 | Ht 70.0 in | Wt 230.3 lb

## 2015-12-04 DIAGNOSIS — R7989 Other specified abnormal findings of blood chemistry: Secondary | ICD-10-CM

## 2015-12-04 DIAGNOSIS — Z131 Encounter for screening for diabetes mellitus: Secondary | ICD-10-CM

## 2015-12-04 DIAGNOSIS — E785 Hyperlipidemia, unspecified: Secondary | ICD-10-CM

## 2015-12-04 DIAGNOSIS — Z79899 Other long term (current) drug therapy: Secondary | ICD-10-CM | POA: Diagnosis not present

## 2015-12-04 DIAGNOSIS — B353 Tinea pedis: Secondary | ICD-10-CM

## 2015-12-04 DIAGNOSIS — Z0000 Encounter for general adult medical examination without abnormal findings: Secondary | ICD-10-CM | POA: Diagnosis not present

## 2015-12-04 DIAGNOSIS — H509 Unspecified strabismus: Secondary | ICD-10-CM | POA: Insufficient documentation

## 2015-12-04 MED ORDER — MEGARED OMEGA-3 KRILL OIL 500 MG PO CAPS: 1.0000 | ORAL_CAPSULE | Freq: Every day | ORAL | 0 refills | Status: AC

## 2015-12-04 MED ORDER — KETOCONAZOLE 2 % EX CREA: 1.0000 "application " | TOPICAL_CREAM | Freq: Every day | CUTANEOUS | 0 refills | Status: DC

## 2015-12-04 NOTE — Progress Notes (Signed)
Name: Cameron Hood   MRN: [id number]    DOB: 1975-11-05   Date:12/04/2015       Progress Note  Subjective  Chief Complaint  Chief Complaint  Patient presents with  . Annual Exam    HPI  Well male: he sees Dr. Ronnald Collum for hypogonadism, and gets testosterone shot - he states it helps with libido and energy. He had TSH and testosterone level done yesterday. We will check other labs.   Tinea Pedis: he states he has cracks on toe webs, painful, tried topical lamisil without help.   Patient Active Problem List   Diagnosis Date Noted  . History of Lyme disease 10/31/2014  . OSA (obstructive sleep apnea) 09/19/2014  . Obesity (BMI 30.0-34.9) 09/19/2014  . Thyroid activity decreased 09/19/2014  . Hypogonadism in male 09/19/2014    Past Surgical History:  Procedure Laterality Date  . ANKLE SURGERY Right     Family History  Problem Relation Age of Onset  . Hypertension Father   . Diabetes Father     Social History   Social History  . Marital status: Married    Spouse name: N/A  . Number of children: N/A  . Years of education: N/A   Occupational History  . Not on file.   Social History Main Topics  . Smoking status: Never Smoker  . Smokeless tobacco: Not on file  . Alcohol use 0.0 oz/week  . Drug use: No  . Sexual activity: Yes   Other Topics Concern  . Not on file   Social History Narrative  . No narrative on file     Current Outpatient Prescriptions:  .  testosterone cypionate (DEPOTESTOSTERONE CYPIONATE) 200 MG/ML injection, Inject into the muscle every 28 (twenty-eight) days., Disp: , Rfl:  .  cetirizine (ZYRTEC) 10 MG tablet, Take 10 mg by mouth daily., Disp: , Rfl:  .  ketoconazole (NIZORAL) 2 % cream, Apply 1 application topically daily., Disp: 30 g, Rfl: 0 .  MEGARED OMEGA-3 KRILL OIL 500 MG CAPS, Take 1 capsule by mouth daily., Disp: 30 capsule, Rfl: 0 .  Multiple Vitamins-Minerals (MULTIVITAMIN WITH MINERALS) tablet, Take 1 tablet by mouth daily.,  Disp: , Rfl:   Allergies  Allergen Reactions  . Mobic [Meloxicam] Other (See Comments)    Heart race     ROS  Constitutional: Negative for fever or weight change.  Respiratory: Negative for cough and shortness of breath.   Cardiovascular: Negative for chest pain or palpitations.  Gastrointestinal: Negative for abdominal pain, no bowel changes.  Musculoskeletal: Negative for gait problem or joint swelling.  Skin: Positive for rash on toe web .  Neurological: Negative for dizziness or headache.  No other specific complaints in a complete review of systems (except as listed in HPI above).  Objective  Vitals:   12/04/15 1424  BP: 120/68  Pulse: 91  Resp: 16  Temp: 98.6 F (37 C)  SpO2: 97%  Weight: 230 lb 5 oz (104.5 kg)  Height: 5' 10"  (1.778 m)    Body mass index is 33.05 kg/m.  Physical Exam  Constitutional: Patient appears well-developed and well-nourished. No distress.  HENT: Head: Normocephalic and atraumatic. Ears: B TMs ok, no erythema or effusion; Nose: Nose normal. Mouth/Throat: Oropharynx is clear and moist. No oropharyngeal exudate.  Eyes: Conjunctivae normal, strabismus noticed. No scleral icterus.  Neck: Normal range of motion. Neck supple. No JVD present. No thyromegaly present.  Cardiovascular: Normal rate, regular rhythm and normal heart sounds.  No murmur heard. No  BLE edema. Pulmonary/Chest: Effort normal and breath sounds normal. No respiratory distress. Abdominal: Soft. Bowel sounds are normal, no distension. There is no tenderness. no masses MALE GENITALIA: Normal descended testes bilaterally, no masses palpated, no hernias, no lesions, no discharge RECTAL: not done Musculoskeletal: Normal range of motion, no joint effusions. No gross deformities Neurological: he is alert and oriented to person, place, and time. No cranial nerve deficit. Coordination, balance, strength, speech and gait are normal.  Skin: Skin is warm and dry. Maceration and redness  on toeweb . No erythema.  Psychiatric: Patient has a normal mood and affect. behavior is normal. Judgment and thought content normal.  PHQ2/9: Depression screen [facility] 2/9 12/04/2015 12/11/2014 09/19/2014  Decreased Interest 0 0 0  Down, Depressed, Hopeless 0 0 0  PHQ - 2 Score 0 0 0    Fall Risk: Fall Risk  12/04/2015 12/11/2014 11/21/2014 10/31/2014 09/19/2014  Falls in the past year? No No No No No     Functional Status Survey: Is the patient deaf or have difficulty hearing?: No Does the patient have difficulty seeing, even when wearing glasses/contacts?: No Does the patient have difficulty concentrating, remembering, or making decisions?: No Does the patient have difficulty walking or climbing stairs?: No Does the patient have difficulty dressing or bathing?: No Does the patient have difficulty doing errands alone such as visiting a doctor's office or shopping?: No    Assessment & Plan  1. Encounter for routine history and physical exam for male  Discussed importance of 150 minutes of physical activity weekly, eat two servings of fish weekly, eat one serving of tree nuts ( cashews, pistachios, pecans, almonds.Marland Kitchen) every other day, eat 6 servings of fruit/vegetables daily and drink plenty of water and avoid sweet beverages.   2. Dyslipidemia  - Lipid panel  3. Abnormal TSH  He will drop off results done by Dr. Ronnald Collum  4. Diabetes mellitus screening  - Hemoglobin A1c  5. Long-term use of high-risk medication  - PSA - COMPLETE METABOLIC PANEL WITH GFR  6. Tinea pedis of both feet  - ketoconazole (NIZORAL) 2 % cream; Apply 1 application topically daily.  Dispense: 30 g; Refill: 0

## 2015-12-05 LAB — COMPLETE METABOLIC PANEL WITH GFR
ALBUMIN: 4.2 g/dL (ref 3.6–5.1)
ALK PHOS: 73 U/L (ref 40–115)
ALT: 19 U/L (ref 9–46)
AST: 17 U/L (ref 10–40)
BILIRUBIN TOTAL: 0.7 mg/dL (ref 0.2–1.2)
BUN: 18 mg/dL (ref 7–25)
CO2: 25 mmol/L (ref 20–31)
CREATININE: 0.99 mg/dL (ref 0.60–1.35)
Calcium: 9.5 mg/dL (ref 8.6–10.3)
Chloride: 104 mmol/L (ref 98–110)
GFR, Est African American: >89 mL/min (ref >=60)
GLUCOSE: 94 mg/dL (ref 65–99)
Potassium: 4.6 mmol/L (ref 3.5–5.3)
Sodium: 141 mmol/L (ref 135–146)
Total Protein: 7.1 g/dL (ref 6.1–8.1)

## 2015-12-05 LAB — LIPID PANEL
CHOL/HDL RATIO: 3.5 ratio (ref <=5.0)
CHOLESTEROL: 138 mg/dL (ref 125–200)
HDL: 39 mg/dL — ABNORMAL LOW (ref >=40)
LDL CALC: 71 mg/dL (ref <130)
Triglycerides: 142 mg/dL (ref <150)
VLDL: 28 mg/dL (ref <30)

## 2015-12-06 LAB — HEMOGLOBIN A1C
Hgb A1c MFr Bld: 5.1 % (ref <5.7)
Mean Plasma Glucose: 100 mg/dL

## 2015-12-06 LAB — PSA: PSA: 0.6 ng/mL (ref <=4.0)

## 2016-04-27 ENCOUNTER — Encounter: Payer: Self-pay | Admitting: Family Medicine

## 2016-10-14 ENCOUNTER — Ambulatory Visit (INDEPENDENT_AMBULATORY_CARE_PROVIDER_SITE_OTHER): Payer: 59 | Admitting: Family Medicine

## 2016-10-14 ENCOUNTER — Encounter: Payer: Self-pay | Admitting: Family Medicine

## 2016-10-14 VITALS — BP 126/72 | HR 75 | Temp 98.3°F | Resp 18 | Wt 233.0 lb

## 2016-10-14 DIAGNOSIS — R05 Cough: Secondary | ICD-10-CM | POA: Diagnosis not present

## 2016-10-14 DIAGNOSIS — R6883 Chills (without fever): Secondary | ICD-10-CM

## 2016-10-14 DIAGNOSIS — Z8619 Personal history of other infectious and parasitic diseases: Secondary | ICD-10-CM | POA: Diagnosis not present

## 2016-10-14 DIAGNOSIS — J029 Acute pharyngitis, unspecified: Secondary | ICD-10-CM | POA: Diagnosis not present

## 2016-10-14 DIAGNOSIS — R0982 Postnasal drip: Secondary | ICD-10-CM | POA: Diagnosis not present

## 2016-10-14 DIAGNOSIS — R059 Cough, unspecified: Secondary | ICD-10-CM

## 2016-10-14 LAB — CBC WITH DIFFERENTIAL/PLATELET
BASOS PCT: 1 %
Basophils Absolute: 53 cells/uL (ref 0–200)
EOS ABS: 159 {cells}/uL (ref 15–500)
Eosinophils Relative: 3 %
HEMATOCRIT: 47.9 % (ref 38.5–50.0)
HEMOGLOBIN: 16.3 g/dL (ref 13.2–17.1)
LYMPHS PCT: 16 %
Lymphs Abs: 848 cells/uL — ABNORMAL LOW (ref 850–3900)
MCH: 29.1 pg (ref 27.0–33.0)
MCHC: 34 g/dL (ref 32.0–36.0)
MCV: 85.5 fL (ref 80.0–100.0)
MONO ABS: 848 {cells}/uL (ref 200–950)
MPV: 9.6 fL (ref 7.5–12.5)
Monocytes Relative: 16 %
Neutro Abs: 3392 cells/uL (ref 1500–7800)
Neutrophils Relative %: 64 %
Platelets: 243 10*3/uL (ref 140–400)
RBC: 5.6 MIL/uL (ref 4.20–5.80)
RDW: 13.5 % (ref 11.0–15.0)
WBC: 5.3 10*3/uL (ref 3.8–10.8)

## 2016-10-14 NOTE — Addendum Note (Signed)
Addended by: Inda Coke on: 10/14/2016 10:19 AM   Modules accepted: Orders

## 2016-10-14 NOTE — Progress Notes (Signed)
Name: Cameron Hood   MRN: [id number]    DOB: [birth year]   Date:10/14/2016       Progress Note  Subjective  Chief Complaint  Chief Complaint  Patient presents with  . Chills    pt having symptoms similar to symptoms that he had upon intial dx, body aches,pain,fever, and night sweats    HPI  He states he started to feel sick two nights ago. Initially a sore spot on his throat a little cough, followed by nasal congestion, post-nasal drainage, chills, subjective fever, mild headache. Also some decrease in appetite. He still has a mild cough, slight sore throat, no change in bowel movements. He is concerned because of night sweats and chills last night and previous history of Lyme's disease. Denies joint pains, he has a chronic rash /eczematous on right anterior lower leg.    Patient Active Problem List   Diagnosis Date Noted  . Strabismus 12/04/2015  . History of Lyme disease 10/31/2014  . OSA (obstructive sleep apnea) 09/19/2014  . Obesity (BMI 30.0-34.9) 09/19/2014  . Thyroid activity decreased 09/19/2014  . Hypogonadism in male 09/19/2014    Past Surgical History:  Procedure Laterality Date  . ANKLE SURGERY Right     Family History  Problem Relation Age of Onset  . Hypertension Father   . Diabetes Father     Social History   Social History  . Marital status: Married    Spouse name: N/A  . Number of children: N/A  . Years of education: N/A   Occupational History  . Not on file.   Social History Main Topics  . Smoking status: Never Smoker  . Smokeless tobacco: Never Used  . Alcohol use 0.0 oz/week  . Drug use: No  . Sexual activity: Yes   Other Topics Concern  . Not on file   Social History Narrative  . No narrative on file     Current Outpatient Prescriptions:  .  ANDROGEL PUMP 20.25 MG/ACT (1.62%) GEL, , Disp: , Rfl:  .  cetirizine (ZYRTEC) 10 MG tablet, Take 10 mg by mouth daily., Disp: , Rfl:  .  ketoconazole (NIZORAL) 2 % cream, Apply 1 application  topically daily., Disp: 30 g, Rfl: 0 .  MEGARED OMEGA-3 KRILL OIL 500 MG CAPS, Take 1 capsule by mouth daily., Disp: 30 capsule, Rfl: 0 .  Multiple Vitamins-Minerals (MULTIVITAMIN WITH MINERALS) tablet, Take 1 tablet by mouth daily., Disp: , Rfl:   Allergies  Allergen Reactions  . Mobic [Meloxicam] Other (See Comments)    Heart race     ROS  Constitutional: Positive for fever and chills or weight change.  Respiratory: Positive for cough no shortness of breath.   Cardiovascular: Negative for chest pain or palpitations.  Gastrointestinal: Negative for abdominal pain, no bowel changes.  Musculoskeletal: Negative for gait problem or joint swelling.  Skin: Negative for rash.  Neurological: Negative for dizziness , positive for headache.  No other specific complaints in a complete review of systems (except as listed in HPI above).  Objective  Vitals:   10/14/16 0951  BP: 126/72  Pulse: 75  Resp: 18  Temp: 98.3 F (36.8 C)  SpO2: 97%  Weight: 233 lb (105.7 kg)    Body mass index is 33.43 kg/m.  Physical Exam  Constitutional: Patient appears well-developed and well-nourished. Obese  No distress.  HEENT: head atraumatic, normocephalic, pupils equal and reactive to light, neck supple, throat within normal limits Cardiovascular: Normal rate, regular rhythm and normal heart sounds.  No murmur heard. No BLE edema. Pulmonary/Chest: Effort normal and breath sounds normal. No respiratory distress. Abdominal: Soft.  There is no tenderness. Skin: eczematous patch on right anterior lower leg Psychiatric: Patient has a normal mood and affect. behavior is normal. Judgment and thought content normal.  PHQ2/9: Depression screen [facility] 2/9 12/04/2015 12/11/2014 09/19/2014  Decreased Interest 0 0 0  Down, Depressed, Hopeless 0 0 0  PHQ - 2 Score 0 0 0     Fall Risk: Fall Risk  12/04/2015 12/11/2014 11/21/2014 10/31/2014 09/19/2014  Falls in the past year? No No No No No     Assessment &  Plan  1. Chills  - CBC with Differential/Platelet - Lyme Disease Abs IgG, IgM, IFA, CSF  2. Post-nasal drainage  Explained likely viral  3. History of Lyme disease  - Lyme Disease Abs IgG, IgM, IFA, CSF  4. Cough  - CBC with Differential/Platelet Mild, advised Mucinex DM rest and fluids  5. Sore throat  Gargle with warm salted water.

## 2016-10-15 LAB — LYME AB/WESTERN BLOT REFLEX

## 2016-12-07 ENCOUNTER — Ambulatory Visit (INDEPENDENT_AMBULATORY_CARE_PROVIDER_SITE_OTHER): Payer: 59 | Admitting: Family Medicine

## 2016-12-07 ENCOUNTER — Ambulatory Visit (INDEPENDENT_AMBULATORY_CARE_PROVIDER_SITE_OTHER): Payer: 59 | Admitting: Orthopedic Surgery

## 2016-12-07 ENCOUNTER — Encounter: Payer: Self-pay | Admitting: Family Medicine

## 2016-12-07 ENCOUNTER — Ambulatory Visit (INDEPENDENT_AMBULATORY_CARE_PROVIDER_SITE_OTHER): Payer: 59

## 2016-12-07 ENCOUNTER — Encounter (INDEPENDENT_AMBULATORY_CARE_PROVIDER_SITE_OTHER): Payer: Self-pay | Admitting: Orthopedic Surgery

## 2016-12-07 VITALS — BP 112/68 | HR 74 | Temp 98.2°F | Resp 16 | Ht 70.0 in | Wt 233.6 lb

## 2016-12-07 DIAGNOSIS — M79672 Pain in left foot: Secondary | ICD-10-CM

## 2016-12-07 DIAGNOSIS — E785 Hyperlipidemia, unspecified: Secondary | ICD-10-CM | POA: Diagnosis not present

## 2016-12-07 DIAGNOSIS — M76822 Posterior tibial tendinitis, left leg: Secondary | ICD-10-CM | POA: Diagnosis not present

## 2016-12-07 DIAGNOSIS — Z131 Encounter for screening for diabetes mellitus: Secondary | ICD-10-CM

## 2016-12-07 DIAGNOSIS — E66811 Obesity, class 1: Secondary | ICD-10-CM

## 2016-12-07 DIAGNOSIS — E291 Testicular hypofunction: Secondary | ICD-10-CM | POA: Diagnosis not present

## 2016-12-07 DIAGNOSIS — E669 Obesity, unspecified: Secondary | ICD-10-CM

## 2016-12-07 DIAGNOSIS — Z23 Encounter for immunization: Secondary | ICD-10-CM

## 2016-12-07 DIAGNOSIS — L309 Dermatitis, unspecified: Secondary | ICD-10-CM

## 2016-12-07 DIAGNOSIS — Z0000 Encounter for general adult medical examination without abnormal findings: Secondary | ICD-10-CM | POA: Diagnosis not present

## 2016-12-07 DIAGNOSIS — Z79899 Other long term (current) drug therapy: Secondary | ICD-10-CM | POA: Diagnosis not present

## 2016-12-07 MED ORDER — TRIAMCINOLONE ACETONIDE 0.1 % EX CREA: 1.0000 | TOPICAL_CREAM | Freq: Two times a day (BID) | CUTANEOUS | 0 refills | Status: DC

## 2016-12-07 NOTE — Progress Notes (Signed)
Office Visit Note   Patient: Cameron Hood           Date of Birth: 06-09-1975           MRN: [id number] Visit Date: 12/07/2016              Requested by: Steele Sizer, [address] [place], [place] [address] PCP: Steele Sizer, MD  Chief Complaint  Patient presents with  . 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 Patient states he is been developing the same symptoms on the left foot. He states that his right foot is completely asymptomatic after surgery. Patient states he works over 12 hours a day on his feet and has increasing pain in the left after prolonged standing. Patient has tried stiff soled running shoes with new balance and has tried orthotics without relief.  Assessment & Plan: Visit Diagnoses:  1. Pain in left foot   2. Posterior tibial tendinitis, left leg     Plan: We will place the posterior tibial tendon brace. If he fails treatment with this we will set him up for surgery with subtalar and talonavicular fusion on the left.  Follow-Up Instructions: Return if symptoms worsen or fail to improve.   Ortho Exam  Patient is alert, oriented, no adenopathy, well-dressed, normal affect, normal respiratory effort. Examination patient has a good dorsalis pedis pulse he does have subtalar motion but this is painful. He is tender to palpation in the sinus Tarsi he is tender to palpation along the posterior tibial tendon. He has pronation and valgus of the forefoot. 80s doubled and heel raise is painful along the posterior tibial tendon on the left.  Imaging: Xr Foot 2 Views Left  Result Date: 12/07/2016 Patient has no sign of a subtalar bar  And has small accessory navicular.  No images are attached to the encounter.  Labs: Lab Results  Component Value Date   HGBA1C 5.1 12/04/2015    Orders:  Orders Placed This  Encounter  Procedures  . XR Foot 2 Views Left   No orders of the defined types were placed in this encounter.    Procedures: No procedures performed  Clinical Data: No additional findings.  ROS:  All other systems negative, except as noted in the HPI. Review of Systems  Objective: Vital Signs: There were no vitals taken for this visit.  Specialty Comments:  No specialty comments available.  PMFS History: Patient Active Problem List   Diagnosis Date Noted  . Dermatitis 12/07/2016  . Strabismus 12/04/2015  . History of Lyme disease 10/31/2014  . OSA (obstructive sleep apnea) 09/19/2014  . Obesity (BMI 30.0-34.9) 09/19/2014  . Thyroid activity decreased 09/19/2014  . Hypogonadism in male 09/19/2014   Past Medical History:  Diagnosis Date  . Disseminated Lyme disease   . Low testosterone   . Lumbago     Family History  Problem Relation Age of Onset  . Hypertension Father   . Diabetes Father   . Anxiety disorder Mother   . Cancer Paternal Grandmother        unsure maybe pancreatic     Past Surgical History:  Procedure Laterality Date  . ANKLE SURGERY Right    Social History   Occupational History  . Dealer    Social History Main Topics  . Smoking status: Never Smoker  . Smokeless  tobacco: Never Used  . Alcohol use 0.0 oz/week     Comment: rarely  . Drug use: No  . Sexual activity: Yes    Partners: Female

## 2016-12-07 NOTE — Progress Notes (Signed)
Name: Cameron Hood   MRN: [id number]    DOB: 01/25/1976   Date:12/07/2016       Progress Note  Subjective  Chief Complaint  Chief Complaint  Patient presents with  . Annual Exam    HPI  Male exam: no sexual dysfunction, on testosterone supplementation for hypogonadism ( 4 pumps daily given by Dr. Ronnald Collum), he states that levels continues to be low. No ED or lack of libido. Wife states his mood is better when he takes medication. He is due for rectal exam. He states he is trying to lose weight, still eating out on a regular basis and works two jobs so unable to be physically active  Obesity: he would like me to fill his Health Screen form for work, however his weight is higher now than it was last year, he eats fast food on a regular basis and is not physically active  Dermatitis: he has a rash on his right lower leg, less aggravating during the summer, but very itchy and red during the winter. Never had it biopsied. He uses lotion.   Patient Active Problem List   Diagnosis Date Noted  . Strabismus 12/04/2015  . History of Lyme disease 10/31/2014  . OSA (obstructive sleep apnea) 09/19/2014  . Obesity (BMI 30.0-34.9) 09/19/2014  . Thyroid activity decreased 09/19/2014  . Hypogonadism in male 09/19/2014    Past Surgical History:  Procedure Laterality Date  . ANKLE SURGERY Right     Family History  Problem Relation Age of Onset  . Hypertension Father   . Diabetes Father   . Anxiety disorder Mother   . Cancer Paternal Grandmother        unsure maybe pancreatic     Social History   Social History  . Marital status: Married    Spouse name: 1  . Number of children: 1  . Years of education: N/A   Occupational History  . Dealer    Social History Main Topics  . Smoking status: Never Smoker  . Smokeless tobacco: Never Used  . Alcohol use 0.0 oz/week     Comment: rarely  . Drug use: No  . Sexual activity: Yes    Partners: Female   Other Topics Concern  . Not on  file   Social History Narrative   Married   Works at [employer], fixes cars, boats, fleet trucks   Also works part time at [employer] part time   One grown child     Current Outpatient Prescriptions:  .  ANDROGEL PUMP 20.25 MG/ACT (1.62%) GEL, , Disp: , Rfl:  .  cetirizine (ZYRTEC) 10 MG tablet, Take 10 mg by mouth daily., Disp: , Rfl:  .  ketoconazole (NIZORAL) 2 % cream, Apply 1 application topically daily., Disp: 30 g, Rfl: 0 .  MEGARED OMEGA-3 KRILL OIL 500 MG CAPS, Take 1 capsule by mouth daily., Disp: 30 capsule, Rfl: 0 .  Multiple Vitamins-Minerals (MULTIVITAMIN WITH MINERALS) tablet, Take 1 tablet by mouth daily., Disp: , Rfl:   Allergies  Allergen Reactions  . Mobic [Meloxicam] Other (See Comments)    Heart race     ROS  Constitutional: Negative for fever or weight change.  Respiratory: Negative for cough and shortness of breath.   Cardiovascular: Negative for chest pain or palpitations.  Gastrointestinal: Negative for abdominal pain, no bowel changes.  Musculoskeletal: Negative for gait problem or joint swelling.  Skin: Positive  for rash on right leg, uses topical medication prn.  Neurological:  Negative for dizziness or headache.  No other specific complaints in a complete review of systems (except as listed in HPI above).  Objective  Vitals:   12/07/16 0848  BP: 112/68  Pulse: 74  Resp: 16  Temp: 98.2 F (36.8 C)  TempSrc: Oral  SpO2: 99%  Weight: 233 lb 9.6 oz (106 kg)  Height: 5' 10"  (1.778 m)    Body mass index is 33.52 kg/m.  Physical Exam  Constitutional: Patient appears well-developed and obese  No distress.  HENT: Head: Normocephalic and atraumatic. Ears: B TMs ok, no erythema or effusion; Nose: Nose normal. Mouth/Throat: Oropharynx is clear and moist. No oropharyngeal exudate.  Eyes: Conjunctivae and EOM are normal. Pupils are equal, round, and reactive to light. No scleral icterus.  Neck: Normal range of motion. Neck  supple. No JVD present. No thyromegaly present.  Cardiovascular: Normal rate, regular rhythm and normal heart sounds.  No murmur heard. No BLE edema. Pulmonary/Chest: Effort normal and breath sounds normal. No respiratory distress. Abdominal: Soft. Bowel sounds are normal, no distension. There is no tenderness. no masses MALE GENITALIA: Normal descended testes bilaterally, no masses palpated, no hernias, no lesions, no discharge RECTAL: Prostate normal size and consistency, no rectal masses or hemorrhoids Musculoskeletal: Normal range of motion, no joint effusions. No gross deformities Neurological: he is alert and oriented to person, place, and time. No cranial nerve deficit. Coordination, balance, strength, speech and gait are normal.  Skin: Skin is warm and dry. He has a chronic erythematous rash , well demarcated, worse during the winter, uses lotion during the winter - discussed biopsy - he would like to try a cream first Psychiatric: Patient has a normal mood and affect. behavior is normal. Judgment and thought content normal.  Recent Results (from the past 2160 hour(s))  Lyme Ab/Western Blot Reflex     Status: None   Collection Time: 10/14/16 10:19 AM  Result Value Ref Range   B burgdorferi Ab IgG+IgM <0.90 <0.90 Index    Comment:                      Index                Interpretation                    -----                --------------                    < 0.90               NEGATIVE                    0.90-1.09            EQUIVOCAL                    > 1.09               POSITIVE   As recommended by the Food and Drug Administration (FDA), all samples with positive or equivocal results on the B. burgdorferi antibody screen will be tested by Western Blot/Immunoblot. Positive or equivocal screening test results should not be interpreted as truly positive until verified as such using a supplemental assay (e.g., B. burgdorferi blot).   The screening test and/or blot for B.  burgdorferi antibodies may be falsely negative in early stages of Lyme disease, including  the period when erythema migrans is apparent.     CBC with Differential/Platelet     Status: Abnormal   Collection Time: 10/14/16 10:21 AM  Result Value Ref Range   WBC 5.3 3.8 - 10.8 K/uL   RBC 5.60 4.20 - 5.80 MIL/uL   Hemoglobin 16.3 13.2 - 17.1 g/dL   HCT 47.9 38.5 - 50.0 %   MCV 85.5 80.0 - 100.0 fL   MCH 29.1 27.0 - 33.0 pg   MCHC 34.0 32.0 - 36.0 g/dL   RDW 13.5 11.0 - 15.0 %   Platelets 243 140 - 400 K/uL   MPV 9.6 7.5 - 12.5 fL   Neutro Abs 3,392 1,500 - 7,800 cells/uL   Lymphs Abs 848 (L) 850 - 3,900 cells/uL   Monocytes Absolute 848 200 - 950 cells/uL   Eosinophils Absolute 159 15 - 500 cells/uL   Basophils Absolute 53 0 - 200 cells/uL   Neutrophils Relative % 64 %   Lymphocytes Relative 16 %   Monocytes Relative 16 %   Eosinophils Relative 3 %   Basophils Relative 1 %   Smear Review Criteria for review not met     PHQ2/9: Depression screen [facility] 2/9 12/07/2016 12/04/2015 12/11/2014 09/19/2014  Decreased Interest 0 0 0 0  Down, Depressed, Hopeless 0 0 0 0  PHQ - 2 Score 0 0 0 0     Fall Risk: Fall Risk  12/07/2016 12/04/2015 12/11/2014 11/21/2014 10/31/2014  Falls in the past year? No No No No No     Functional Status Survey: Is the patient deaf or have difficulty hearing?: No Does the patient have difficulty seeing, even when wearing glasses/contacts?: No Does the patient have difficulty concentrating, remembering, or making decisions?: No Does the patient have difficulty walking or climbing stairs?: No Does the patient have difficulty dressing or bathing?: No Does the patient have difficulty doing errands alone such as visiting a doctor's office or shopping?: No    Assessment & Plan  1. Encounter for routine history and physical exam for male  Discussed importance of 150 minutes of physical activity weekly, eat two servings of fish weekly, eat one serving of tree nuts  ( cashews, pistachios, pecans, almonds.Marland Kitchen) every other day, eat 6 servings of fruit/vegetables daily and drink plenty of water and avoid sweet beverages.  - VITAMIN D 25 Hydroxy (Vit-D Deficiency, Fractures) - Vitamin B12 -EKG  2. Influenza vaccine needed  - Flu Vaccine QUAD 36+ mos IM  3. Dyslipidemia  - Lipid panel -EKG  4. Long-term use of high-risk medication  - Comp. Metabolic Panel (12)  5. Diabetes mellitus screening  - Hemoglobin A1c  6. Obesity (BMI 30.0-34.9)  Discussed with the patient the risk posed by an increased BMI. Discussed importance of portion control, calorie counting and at least 150 minutes of physical activity weekly. Avoid sweet beverages and drink more water. Eat at least 6 servings of fruit and vegetables daily  - Insulin, 2 Hour  7. Hypogonadism in male  Continue follow up with dr. Ronnald Collum  8. Dermatitis  - triamcinolone cream (KENALOG) 0.1 %; Apply 1 application topically 2 (two) times daily.  Dispense: 453.6 g; Refill: 0

## 2016-12-07 NOTE — Patient Instructions (Addendum)

## 2016-12-08 LAB — LIPID PANEL
CHOL/HDL RATIO: 4.2 ratio (ref 0.0–5.0)
CHOLESTEROL TOTAL: 150 mg/dL (ref 100–199)
HDL: 36 mg/dL — ABNORMAL LOW (ref >39)
LDL CALC: 90 mg/dL (ref 0–99)
Triglycerides: 121 mg/dL (ref 0–149)
VLDL Cholesterol Cal: 24 mg/dL (ref 5–40)

## 2016-12-08 LAB — COMP. METABOLIC PANEL (12)
ALBUMIN: 4.5 g/dL (ref 3.5–5.5)
AST: 19 IU/L (ref 0–40)
Albumin/Globulin Ratio: 1.6 (ref 1.2–2.2)
Alkaline Phosphatase: 94 IU/L (ref 39–117)
BILIRUBIN TOTAL: 0.8 mg/dL (ref 0.0–1.2)
BUN / CREAT RATIO: 13 (ref 9–20)
BUN: 14 mg/dL (ref 6–24)
CREATININE: 1.07 mg/dL (ref 0.76–1.27)
Calcium: 9.5 mg/dL (ref 8.7–10.2)
Chloride: 100 mmol/L (ref 96–106)
GFR calc Af Amer: 98 mL/min/{1.73_m2} (ref >59)
GFR, EST NON AFRICAN AMERICAN: 85 mL/min/{1.73_m2} (ref >59)
GLOBULIN, TOTAL: 2.9 g/dL (ref 1.5–4.5)
GLUCOSE: 93 mg/dL (ref 65–99)
Potassium: 4.5 mmol/L (ref 3.5–5.2)
SODIUM: 140 mmol/L (ref 134–144)
TOTAL PROTEIN: 7.4 g/dL (ref 6.0–8.5)

## 2016-12-08 LAB — VITAMIN D 25 HYDROXY (VIT D DEFICIENCY, FRACTURES): VIT D 25 HYDROXY: 31.4 ng/mL (ref 30.0–100.0)

## 2016-12-08 LAB — HEMOGLOBIN A1C
Est. average glucose Bld gHb Est-mCnc: 105 mg/dL
Hgb A1c MFr Bld: 5.3 % (ref 4.8–5.6)

## 2016-12-08 LAB — VITAMIN B12: Vitamin B-12: 722 pg/mL (ref 232–1245)

## 2016-12-08 LAB — INSULIN, 2 HOUR: INSULIN, 2 HOUR: 11.9 u[IU]/mL (ref 0.0–145.4)

## 2016-12-22 ENCOUNTER — Telehealth (INDEPENDENT_AMBULATORY_CARE_PROVIDER_SITE_OTHER): Payer: Self-pay | Admitting: Orthopedic Surgery

## 2016-12-22 NOTE — Telephone Encounter (Signed)
Patient would like to schedule surgery with Dr. Sharol Given as soon as possible. CB # [phone]

## 2016-12-29 ENCOUNTER — Ambulatory Visit (INDEPENDENT_AMBULATORY_CARE_PROVIDER_SITE_OTHER): Payer: 59 | Admitting: Family Medicine

## 2016-12-29 ENCOUNTER — Encounter: Payer: Self-pay | Admitting: Family Medicine

## 2016-12-29 VITALS — BP 126/84 | HR 85 | Temp 98.4°F | Resp 16 | Ht 70.0 in | Wt 222.2 lb

## 2016-12-29 DIAGNOSIS — E785 Hyperlipidemia, unspecified: Secondary | ICD-10-CM | POA: Diagnosis not present

## 2016-12-29 DIAGNOSIS — E669 Obesity, unspecified: Secondary | ICD-10-CM | POA: Diagnosis not present

## 2016-12-29 NOTE — Progress Notes (Signed)
Name: Cameron Hood   MRN: [id number]    DOB: 10/10/1975   Date:12/29/2016       Progress Note  Subjective  Chief Complaint  Chief Complaint  Patient presents with  . Obesity    Needs to have BMI form for wife work filled out for [employer], has been watching his food intake. Eating healthier-more salads and grilled chicken.    HPI  Dyslipidemia: he has a long history of low HDL, he is taking omega 3 fatty acids otc, he works two jobs and does not have time to exercise, discussed importance of eating fish, tree nuts and increase fruit and vegetables on his diet  Obesity: he has changed his diet in the past month and he lost 11 lbs since last visit.  Patient Active Problem List   Diagnosis Date Noted  . Dermatitis 12/07/2016  . Strabismus 12/04/2015  . History of Lyme disease 10/31/2014  . OSA (obstructive sleep apnea) 09/19/2014  . Obesity (BMI 30.0-34.9) 09/19/2014  . Thyroid activity decreased 09/19/2014  . Hypogonadism in male 09/19/2014    Past Surgical History:  Procedure Laterality Date  . ANKLE SURGERY Right     Family History  Problem Relation Age of Onset  . Hypertension Father   . Diabetes Father   . Anxiety disorder Mother   . Cancer Paternal Grandmother        unsure maybe pancreatic     Social History   Social History  . Marital status: Married    Spouse name: 1  . Number of children: 1  . Years of education: N/A   Occupational History  . Dealer    Social History Main Topics  . Smoking status: Never Smoker  . Smokeless tobacco: Never Used  . Alcohol use 0.0 oz/week     Comment: rarely  . Drug use: No  . Sexual activity: Yes    Partners: Female   Other Topics Concern  . Not on file   Social History Narrative   Married   Works at [employer], fixes cars, boats, fleet trucks   Also works part time at [employer] part time   One grown child     Current Outpatient Prescriptions:  .  ANDROGEL PUMP 20.25 MG/ACT (1.62%)  GEL, , Disp: , Rfl:  .  cetirizine (ZYRTEC) 10 MG tablet, Take 10 mg by mouth daily., Disp: , Rfl:  .  ketoconazole (NIZORAL) 2 % cream, Apply 1 application topically daily., Disp: 30 g, Rfl: 0 .  MEGARED OMEGA-3 KRILL OIL 500 MG CAPS, Take 1 capsule by mouth daily., Disp: 30 capsule, Rfl: 0 .  Multiple Vitamins-Minerals (MULTIVITAMIN WITH MINERALS) tablet, Take 1 tablet by mouth daily., Disp: , Rfl:  .  triamcinolone cream (KENALOG) 0.1 %, Apply 1 application topically 2 (two) times daily., Disp: 453.6 g, Rfl: 0  Allergies  Allergen Reactions  . Mobic [Meloxicam] Other (See Comments)    Heart race     ROS  Ten systems reviewed and is negative except as mentioned in HPI   Objective  Vitals:   12/29/16 0838  BP: 126/84  Pulse: 85  Resp: 16  Temp: 98.4 F (36.9 C)  TempSrc: Oral  SpO2: 98%  Weight: 222 lb 3.2 oz (100.8 kg)  Height: 5' 10"  (1.778 m)    Body mass index is 31.88 kg/m.  Physical Exam  Constitutional: Patient appears well-developed and well-nourished. Obese No distress.  HEENT: head atraumatic, normocephalic, pupils equal and reactive to light,  neck supple, throat within normal limits Cardiovascular: Normal rate, regular rhythm and normal heart sounds.  No murmur heard. No BLE edema. Pulmonary/Chest: Effort normal and breath sounds normal. No respiratory distress. Abdominal: Soft.  There is no tenderness. Psychiatric: Patient has a normal mood and affect. behavior is normal. Judgment and thought content normal.   Recent Results (from the past 2160 hour(s))  Lyme Ab/Western Blot Reflex     Status: None   Collection Time: 10/14/16 10:19 AM  Result Value Ref Range   B burgdorferi Ab IgG+IgM <0.90 <0.90 Index    Comment:                      Index                Interpretation                    -----                --------------                    < 0.90               NEGATIVE                    0.90-1.09            EQUIVOCAL                    > 1.09                POSITIVE   As recommended by the Food and Drug Administration (FDA), all samples with positive or equivocal results on the B. burgdorferi antibody screen will be tested by Western Blot/Immunoblot. Positive or equivocal screening test results should not be interpreted as truly positive until verified as such using a supplemental assay (e.g., B. burgdorferi blot).   The screening test and/or blot for B. burgdorferi antibodies may be falsely negative in early stages of Lyme disease, including the period when erythema migrans is apparent.     CBC with Differential/Platelet     Status: Abnormal   Collection Time: 10/14/16 10:21 AM  Result Value Ref Range   WBC 5.3 3.8 - 10.8 K/uL   RBC 5.60 4.20 - 5.80 MIL/uL   Hemoglobin 16.3 13.2 - 17.1 g/dL   HCT 47.9 38.5 - 50.0 %   MCV 85.5 80.0 - 100.0 fL   MCH 29.1 27.0 - 33.0 pg   MCHC 34.0 32.0 - 36.0 g/dL   RDW 13.5 11.0 - 15.0 %   Platelets 243 140 - 400 K/uL   MPV 9.6 7.5 - 12.5 fL   Neutro Abs 3,392 1,500 - 7,800 cells/uL   Lymphs Abs 848 (L) 850 - 3,900 cells/uL   Monocytes Absolute 848 200 - 950 cells/uL   Eosinophils Absolute 159 15 - 500 cells/uL   Basophils Absolute 53 0 - 200 cells/uL   Neutrophils Relative % 64 %   Lymphocytes Relative 16 %   Monocytes Relative 16 %   Eosinophils Relative 3 %   Basophils Relative 1 %   Smear Review Criteria for review not met   Hemoglobin A1c     Status: None   Collection Time: 12/07/16 10:56 AM  Result Value Ref Range   Hgb A1c MFr Bld 5.3 4.8 - 5.6 %    Comment:  Prediabetes: 5.7 - 6.4          Diabetes: >6.4          Glycemic control for adults with diabetes: <7.0    Est. average glucose Bld gHb Est-mCnc 105 mg/dL  Lipid panel     Status: Abnormal   Collection Time: 12/07/16 10:56 AM  Result Value Ref Range   Cholesterol, Total 150 100 - 199 mg/dL   Triglycerides 121 0 - 149 mg/dL   HDL 36 (L) >39 mg/dL   VLDL Cholesterol Cal 24 5 - 40 mg/dL   LDL Calculated  90 0 - 99 mg/dL   Chol/HDL Ratio 4.2 0.0 - 5.0 ratio    Comment:                                   T. Chol/HDL Ratio                                             Men  Women                               1/2 Avg.Risk  3.4    3.3                                   Avg.Risk  5.0    4.4                                2X Avg.Risk  9.6    7.1                                3X Avg.Risk 23.4   11.0   VITAMIN D 25 Hydroxy (Vit-D Deficiency, Fractures)     Status: None   Collection Time: 12/07/16 10:56 AM  Result Value Ref Range   Vit D, 25-Hydroxy 31.4 30.0 - 100.0 ng/mL    Comment: Vitamin D deficiency has been defined by the [place] practice guideline as a level of serum 25-OH vitamin D less than 20 ng/mL (1,2). The Endocrine Society went on to further define vitamin D insufficiency as a level between 21 and 29 ng/mL (2). 1. IOM (Institute of Medicine). 2010. Dietary reference    intakes for calcium and D. [place]: The    [employer]. 2. Holick MF, Binkley [place], Bischoff-Ferrari HA, et al.    Evaluation, treatment, and prevention of vitamin D    deficiency: an Endocrine Society clinical practice    guideline. JCEM. 2011 Jul; 96(7):1911-30.   Vitamin B12     Status: None   Collection Time: 12/07/16 10:56 AM  Result Value Ref Range   Vitamin B-12 722 232 - 1,245 pg/mL  Insulin, 2 Hour     Status: None   Collection Time: 12/07/16 10:56 AM  Result Value Ref Range   Insulin, 2 hour 11.9 0.0 - 145.4 uIU/mL  Comp. Metabolic Panel (12)     Status: None   Collection Time: 12/07/16 10:56 AM  Result Value Ref Range   Glucose  93 65 - 99 mg/dL   BUN 14 6 - 24 mg/dL   Creatinine, Ser 1.07 0.76 - 1.27 mg/dL   GFR calc non Af Amer 85 >59 mL/min/1.73   GFR calc Af Amer 98 >59 mL/min/1.73   BUN/Creatinine Ratio 13 9 - 20   Sodium 140 134 - 144 mmol/L   Potassium 4.5 3.5 - 5.2 mmol/L   Chloride 100 96 - 106 mmol/L   Calcium 9.5 8.7 - 10.2  mg/dL   Total Protein 7.4 6.0 - 8.5 g/dL   Albumin 4.5 3.5 - 5.5 g/dL   Globulin, Total 2.9 1.5 - 4.5 g/dL   Albumin/Globulin Ratio 1.6 1.2 - 2.2   Bilirubin Total 0.8 0.0 - 1.2 mg/dL   Alkaline Phosphatase 94 39 - 117 IU/L   AST 19 0 - 40 IU/L     PHQ2/9: Depression screen [facility] 2/9 12/07/2016 12/04/2015 12/11/2014 09/19/2014  Decreased Interest 0 0 0 0  Down, Depressed, Hopeless 0 0 0 0  PHQ - 2 Score 0 0 0 0     Fall Risk: Fall Risk  12/07/2016 12/04/2015 12/11/2014 11/21/2014 10/31/2014  Falls in the past year? No No No No No      Assessment & Plan  1. Dyslipidemia  Lipid panel shows low HDL : to improve HDL patient  needs to eat tree nuts ( pecans/pistachios/almonds ) four times weekly, eat fish two times weekly  and exercise  at least 150 minutes per week  2. Obesity (BMI 30.0-34.9)  Discussed with the patient the risk posed by an increased BMI. Discussed importance of portion control, calorie counting and at least 150 minutes of physical activity weekly. Avoid sweet beverages and drink more water. Eat at least 6 servings of fruit and vegetables daily  He lost 11 lbs since last visit

## 2017-02-22 ENCOUNTER — Other Ambulatory Visit (INDEPENDENT_AMBULATORY_CARE_PROVIDER_SITE_OTHER): Payer: Self-pay | Admitting: Family

## 2017-02-23 NOTE — Pre-Procedure Instructions (Addendum)
ABBIE JABLON  02/23/2017      [name] [id number]: [phone]: [phone]   Your procedure is scheduled on February 26, 2017.  [facility] [id number].  Call this number if you have problems the morning of surgery:  [phone]   Remember:  Do not eat food or drink liquids after midnight.  Take these medicines the morning of surgery with A SIP OF WATER acetaminophen (tylenol)-if needed, loratidine (claritin).   7 days prior to surgery STOP taking any Aspirin (unless otherwise instructed by your surgeon), Aleve, Naproxen, Ibuprofen, Motrin, Advil, Goody's, BC's, all herbal medications, fish oil, and all vitamins  Continue all other medications as instructed by your physician except follow the above medication instructions before surgery   Do not wear jewelry..  Do not wear lotions, powders, or perfumes, or deoderant.  Men may shave face and neck.  Do not bring valuables to the hospital.  [facility] any belongings or [facility]ables.  Contacts, dentures or bridgework may not be worn into surgery.  Leave your suitcase in the car.  After surgery it may be brought to your room.  For patients admitted to the hospital, discharge time will be determined by your treatment team.  Patients discharged the day of surgery will not be allowed to drive home.   Special instructions:   [place]- Preparing For Surgery  Before surgery, you can play an important role. Because skin is not sterile, your skin needs to be as free of germs as possible. You can reduce the number of germs on your skin by washing with CHG (chlorahexidine gluconate) Soap before surgery.  CHG is an antiseptic cleaner which kills germs and bonds with the skin to continue killing germs even after washing.  Please do not use if you have an allergy to CHG or antibacterial soaps.  If your skin becomes reddened/irritated stop using the CHG.  Do not shave (including legs and underarms) for at least 48 hours prior to first CHG shower. It is OK to shave your face.  Please follow these instructions carefully.   1. Shower the NIGHT BEFORE SURGERY and the MORNING OF SURGERY with CHG.   2. If you chose to wash your hair, wash your hair first as usual with your normal shampoo.  3. After you shampoo, rinse your hair and body thoroughly to remove the shampoo.  4. Use CHG as you would any other liquid soap. You can apply CHG directly to the skin and wash gently with a scrungie or a clean washcloth.   5. Apply the CHG Soap to your body ONLY FROM THE NECK DOWN.  Do not use on open wounds or open sores. Avoid contact with your eyes, ears, mouth and genitals (private parts). Wash Face and genitals (private parts)  with your normal soap.  6. Wash thoroughly, paying special attention to the area where your surgery will be performed.  7. Thoroughly rinse your body with warm water from the neck down.  8. DO NOT shower/wash with your normal soap after using and rinsing off the CHG Soap.  9. Pat yourself dry with a CLEAN TOWEL.  10. Wear CLEAN PAJAMAS to bed the night before surgery, wear comfortable clothes the morning of surgery  11. Place CLEAN SHEETS on your bed the night of your first shower and DO NOT SLEEP WITH PETS.  Day of Surgery: Do not apply any deodorants/lotions. Please wear clean clothes to the hospital/surgery center.     Please read over the following fact sheets that you were given. Pain Booklet, Coughing and Deep Breathing and Surgical Site Infection Prevention

## 2017-02-24 ENCOUNTER — Encounter (HOSPITAL_COMMUNITY)
Admission: RE | Admit: 2017-02-24 | Discharge: 2017-02-24 | Disposition: A | Discharge status: Home / Self Care | Payer: 59 | Source: Ambulatory Visit | Attending: Orthopedic Surgery | Admitting: Orthopedic Surgery

## 2017-02-24 ENCOUNTER — Encounter (HOSPITAL_COMMUNITY): Payer: Self-pay

## 2017-02-24 ENCOUNTER — Other Ambulatory Visit: Payer: Self-pay

## 2017-02-24 DIAGNOSIS — Z818 Family history of other mental and behavioral disorders: Secondary | ICD-10-CM | POA: Diagnosis not present

## 2017-02-24 DIAGNOSIS — Z809 Family history of malignant neoplasm, unspecified: Secondary | ICD-10-CM | POA: Diagnosis not present

## 2017-02-24 DIAGNOSIS — M19072 Primary osteoarthritis, left ankle and foot: Secondary | ICD-10-CM | POA: Diagnosis not present

## 2017-02-24 DIAGNOSIS — Z8249 Family history of ischemic heart disease and other diseases of the circulatory system: Secondary | ICD-10-CM | POA: Diagnosis not present

## 2017-02-24 DIAGNOSIS — K219 Gastro-esophageal reflux disease without esophagitis: Secondary | ICD-10-CM | POA: Diagnosis not present

## 2017-02-24 DIAGNOSIS — M76822 Posterior tibial tendinitis, left leg: Secondary | ICD-10-CM | POA: Diagnosis present

## 2017-02-24 DIAGNOSIS — E291 Testicular hypofunction: Secondary | ICD-10-CM | POA: Diagnosis not present

## 2017-02-24 DIAGNOSIS — G8929 Other chronic pain: Secondary | ICD-10-CM | POA: Diagnosis not present

## 2017-02-24 DIAGNOSIS — Z888 Allergy status to other drugs, medicaments and biological substances status: Secondary | ICD-10-CM | POA: Diagnosis not present

## 2017-02-24 DIAGNOSIS — E669 Obesity, unspecified: Secondary | ICD-10-CM | POA: Diagnosis not present

## 2017-02-24 DIAGNOSIS — M545 Low back pain: Secondary | ICD-10-CM | POA: Diagnosis not present

## 2017-02-24 DIAGNOSIS — Z833 Family history of diabetes mellitus: Secondary | ICD-10-CM | POA: Diagnosis not present

## 2017-02-24 DIAGNOSIS — Z79899 Other long term (current) drug therapy: Secondary | ICD-10-CM | POA: Diagnosis not present

## 2017-02-24 DIAGNOSIS — Z8619 Personal history of other infectious and parasitic diseases: Secondary | ICD-10-CM | POA: Diagnosis not present

## 2017-02-24 HISTORY — DX: Gastro-esophageal reflux disease without esophagitis: K21.9

## 2017-02-24 LAB — CBC
HEMATOCRIT: 46.1 % (ref 39.0–52.0)
HEMOGLOBIN: 15.4 g/dL (ref 13.0–17.0)
MCH: 28.7 pg (ref 26.0–34.0)
MCHC: 33.4 g/dL (ref 30.0–36.0)
MCV: 85.8 fL (ref 78.0–100.0)
Platelets: 258 10*3/uL (ref 150–400)
RBC: 5.37 MIL/uL (ref 4.22–5.81)
RDW: 13.8 % (ref 11.5–15.5)
WBC: 6.3 10*3/uL (ref 4.0–10.5)

## 2017-02-24 LAB — SURGICAL PCR SCREEN
MRSA, PCR: NEGATIVE
Staphylococcus aureus: POSITIVE — AB

## 2017-02-24 NOTE — Pre-Procedure Instructions (Signed)
Cameron Hood  02/24/2017      [id number] [address]Phone: [phone]: [phone]   Your procedure is scheduled on February 26, 2017.  [facility]  Call this number if you have problems the morning of surgery:  [phone]   Remember:  Do not eat food or drink liquids after midnight.  Take these medicines the morning of surgery with A SIP OF WATER acetaminophen (tylenol)-if needed, loratidine (claritin).   7 days prior to surgery STOP taking any Aspirin (unless otherwise instructed by your surgeon), Aleve, Naproxen, Ibuprofen, Motrin, Advil, Goody's, BC's, all herbal medications, fish oil, and all vitamins  Continue all other medications as instructed by your physician except follow the above medication instructions before surgery   Do not wear jewelry..  Do not wear lotions, powders, or perfumes, or deoderant.  Men may shave face and neck.  Do not bring valuables to the hospital.  [facility] belongings or [facility].  Contacts, dentures or bridgework may not be worn into surgery.  Leave your suitcase in the car.  After surgery it may be brought to your room.  For patients admitted to the hospital, discharge time will be determined by your treatment team.  Patients discharged the day of surgery will not be allowed to drive home.   Special instructions:   [place]- Preparing For Surgery  Before surgery, you can play an important role. Because skin is not sterile, your skin needs to be as free of germs as possible. You can reduce the number of germs on your skin by washing with CHG (chlorahexidine gluconate) Soap before surgery.  CHG is an antiseptic cleaner which kills germs and bonds with the skin to continue killing germs even after washing.  Please do not use if you have an allergy to CHG or antibacterial soaps. If  your skin becomes reddened/irritated stop using the CHG.  Do not shave (including legs and underarms) for at least 48 hours prior to first CHG shower. It is OK to shave your face.  Please follow these instructions carefully.   1. Shower the NIGHT BEFORE SURGERY and the MORNING OF SURGERY with CHG.   2. If you chose to wash your hair, wash your hair first as usual with your normal shampoo.  3. After you shampoo, rinse your hair and body thoroughly to remove the shampoo.  4. Use CHG as you would any other liquid soap. You can apply CHG directly to the skin and wash gently with a scrungie or a clean washcloth.   5. Apply the CHG Soap to your body ONLY FROM THE NECK DOWN.  Do not use on open wounds or open sores. Avoid contact with your eyes, ears, mouth and genitals (private parts). Wash Face and genitals (private parts)  with your normal soap.  6. Wash thoroughly, paying special attention to the area where your surgery will be performed.  7. Thoroughly rinse your body with warm water from the neck down.  8. DO NOT shower/wash with your normal soap after using and rinsing off the CHG Soap.  9. Pat yourself dry with a CLEAN TOWEL.  10. Wear CLEAN PAJAMAS to bed the night before surgery, wear comfortable clothes the morning of surgery  11. Place CLEAN SHEETS on your bed the night of your first shower and DO NOT SLEEP WITH PETS.  Day of Surgery: Do not apply any deodorants/lotions. Please wear clean clothes to the hospital/surgery center.     Please read over the following fact sheets that you were given. Pain Booklet, Coughing and Deep Breathing and Surgical Site Infection Prevention

## 2017-02-26 ENCOUNTER — Ambulatory Visit (HOSPITAL_COMMUNITY): Payer: 59 | Admitting: Certified Registered"

## 2017-02-26 ENCOUNTER — Other Ambulatory Visit: Payer: Self-pay

## 2017-02-26 ENCOUNTER — Ambulatory Visit (HOSPITAL_COMMUNITY)
Admission: RE | Admit: 2017-02-26 | Discharge: 2017-02-27 | Disposition: A | Discharge status: Home / Self Care | Payer: 59 | Source: Ambulatory Visit | Attending: Orthopedic Surgery | Admitting: Orthopedic Surgery

## 2017-02-26 ENCOUNTER — Encounter (HOSPITAL_COMMUNITY): Payer: Self-pay | Admitting: Certified Registered"

## 2017-02-26 ENCOUNTER — Encounter (HOSPITAL_COMMUNITY)
Admission: RE | Disposition: A | Discharge status: Home / Self Care | Payer: Self-pay | Source: Ambulatory Visit | Attending: Orthopedic Surgery

## 2017-02-26 DIAGNOSIS — Z8619 Personal history of other infectious and parasitic diseases: Secondary | ICD-10-CM | POA: Insufficient documentation

## 2017-02-26 DIAGNOSIS — Z79899 Other long term (current) drug therapy: Secondary | ICD-10-CM | POA: Insufficient documentation

## 2017-02-26 DIAGNOSIS — Z888 Allergy status to other drugs, medicaments and biological substances status: Secondary | ICD-10-CM | POA: Insufficient documentation

## 2017-02-26 DIAGNOSIS — M76822 Posterior tibial tendinitis, left leg: Secondary | ICD-10-CM | POA: Insufficient documentation

## 2017-02-26 DIAGNOSIS — M19072 Primary osteoarthritis, left ankle and foot: Secondary | ICD-10-CM | POA: Diagnosis not present

## 2017-02-26 DIAGNOSIS — E669 Obesity, unspecified: Secondary | ICD-10-CM | POA: Insufficient documentation

## 2017-02-26 DIAGNOSIS — Z833 Family history of diabetes mellitus: Secondary | ICD-10-CM | POA: Insufficient documentation

## 2017-02-26 DIAGNOSIS — E291 Testicular hypofunction: Secondary | ICD-10-CM | POA: Insufficient documentation

## 2017-02-26 DIAGNOSIS — G8929 Other chronic pain: Secondary | ICD-10-CM | POA: Insufficient documentation

## 2017-02-26 DIAGNOSIS — Z818 Family history of other mental and behavioral disorders: Secondary | ICD-10-CM | POA: Insufficient documentation

## 2017-02-26 DIAGNOSIS — K219 Gastro-esophageal reflux disease without esophagitis: Secondary | ICD-10-CM | POA: Insufficient documentation

## 2017-02-26 DIAGNOSIS — Z8249 Family history of ischemic heart disease and other diseases of the circulatory system: Secondary | ICD-10-CM | POA: Insufficient documentation

## 2017-02-26 DIAGNOSIS — M545 Low back pain: Secondary | ICD-10-CM | POA: Insufficient documentation

## 2017-02-26 DIAGNOSIS — Z809 Family history of malignant neoplasm, unspecified: Secondary | ICD-10-CM | POA: Insufficient documentation

## 2017-02-26 HISTORY — PX: ANKLE FUSION: SHX5718

## 2017-02-26 HISTORY — PX: FUSION OF TALONAVICULAR JOINT: SHX6332

## 2017-02-26 SURGERY — ANKLE FUSION
Anesthesia: General | Laterality: Left

## 2017-02-26 MED ORDER — CEFAZOLIN SODIUM-DEXTROSE 2-4 GM/100ML-% IV SOLN
2.0000 g | Freq: Four times a day (QID) | INTRAVENOUS | Status: AC
  Administered 2017-02-26 (×3): 2 g via INTRAVENOUS
  Filled 2017-02-26 (×3): qty 100

## 2017-02-26 MED ORDER — METOCLOPRAMIDE HCL 5 MG/ML IJ SOLN: 5.0000 mg | Freq: Three times a day (TID) | INTRAMUSCULAR | Status: DC | PRN

## 2017-02-26 MED ORDER — MAGNESIUM CITRATE PO SOLN: 1.0000 | Freq: Once | ORAL | Status: DC | PRN

## 2017-02-26 MED ORDER — DEXAMETHASONE SODIUM PHOSPHATE 10 MG/ML IJ SOLN
INTRAMUSCULAR | Status: AC
  Filled 2017-02-26: qty 1

## 2017-02-26 MED ORDER — OXYCODONE HCL 5 MG PO TABS
10.0000 mg | ORAL_TABLET | ORAL | Status: DC | PRN
  Administered 2017-02-26 – 2017-02-27 (×2): 10 mg via ORAL
  Filled 2017-02-26 (×2): qty 2

## 2017-02-26 MED ORDER — ROPIVACAINE HCL 5 MG/ML IJ SOLN
INTRAMUSCULAR | Status: DC | PRN
  Administered 2017-02-26: 30 mL via PERINEURAL

## 2017-02-26 MED ORDER — HYDROCODONE-ACETAMINOPHEN 5-325 MG PO TABS
1.0000 | ORAL_TABLET | ORAL | Status: DC | PRN
  Administered 2017-02-26 – 2017-02-27 (×3): 1 via ORAL
  Filled 2017-02-26 (×3): qty 1

## 2017-02-26 MED ORDER — HYDROMORPHONE HCL 1 MG/ML IJ SOLN: 0.2500 mg | INTRAMUSCULAR | Status: DC | PRN

## 2017-02-26 MED ORDER — METHOCARBAMOL 1000 MG/10ML IJ SOLN
500.0000 mg | Freq: Four times a day (QID) | INTRAVENOUS | Status: DC | PRN
  Filled 2017-02-26: qty 5

## 2017-02-26 MED ORDER — ONDANSETRON HCL 4 MG PO TABS: 4.0000 mg | ORAL_TABLET | Freq: Four times a day (QID) | ORAL | Status: DC | PRN

## 2017-02-26 MED ORDER — MIDAZOLAM HCL 2 MG/2ML IJ SOLN
INTRAMUSCULAR | Status: AC
  Filled 2017-02-26: qty 2

## 2017-02-26 MED ORDER — METOCLOPRAMIDE HCL 5 MG PO TABS: 5.0000 mg | ORAL_TABLET | Freq: Three times a day (TID) | ORAL | Status: DC | PRN

## 2017-02-26 MED ORDER — PROPOFOL 10 MG/ML IV BOLUS
INTRAVENOUS | Status: DC | PRN
Start: 2017-02-26 — End: 2017-02-26
  Administered 2017-02-26: 200 mg via INTRAVENOUS

## 2017-02-26 MED ORDER — OXYCODONE HCL 5 MG/5ML PO SOLN: 5.0000 mg | Freq: Once | ORAL | Status: DC | PRN

## 2017-02-26 MED ORDER — CHLORHEXIDINE GLUCONATE 4 % EX LIQD: 60.0000 mL | Freq: Once | CUTANEOUS | Status: DC

## 2017-02-26 MED ORDER — ACETAMINOPHEN 325 MG PO TABS: 650.0000 mg | ORAL_TABLET | ORAL | Status: DC | PRN

## 2017-02-26 MED ORDER — KETOROLAC TROMETHAMINE 15 MG/ML IJ SOLN
15.0000 mg | Freq: Four times a day (QID) | INTRAMUSCULAR | Status: AC
  Administered 2017-02-27: 15 mg via INTRAVENOUS
  Filled 2017-02-26 (×2): qty 1

## 2017-02-26 MED ORDER — 0.9 % SODIUM CHLORIDE (POUR BTL) OPTIME
TOPICAL | Status: DC | PRN
  Administered 2017-02-26: 1000 mL

## 2017-02-26 MED ORDER — MEPERIDINE HCL 25 MG/ML IJ SOLN: 6.2500 mg | INTRAMUSCULAR | Status: DC | PRN

## 2017-02-26 MED ORDER — DEXAMETHASONE SODIUM PHOSPHATE 10 MG/ML IJ SOLN
INTRAMUSCULAR | Status: DC | PRN
Start: 2017-02-26 — End: 2017-02-26
  Administered 2017-02-26: 10 mg via INTRAVENOUS

## 2017-02-26 MED ORDER — LIDOCAINE 2% (20 MG/ML) 5 ML SYRINGE
INTRAMUSCULAR | Status: DC | PRN
Start: 2017-02-26 — End: 2017-02-26
  Administered 2017-02-26: 100 mg via INTRAVENOUS

## 2017-02-26 MED ORDER — BISACODYL 10 MG RE SUPP: 10.0000 mg | Freq: Every day | RECTAL | Status: DC | PRN

## 2017-02-26 MED ORDER — ACETAMINOPHEN 650 MG RE SUPP: 650.0000 mg | RECTAL | Status: DC | PRN

## 2017-02-26 MED ORDER — LACTATED RINGERS IV SOLN
INTRAVENOUS | Status: DC | PRN
  Administered 2017-02-26: 07:00:00 via INTRAVENOUS

## 2017-02-26 MED ORDER — FENTANYL CITRATE (PF) 100 MCG/2ML IJ SOLN
INTRAMUSCULAR | Status: DC | PRN
  Administered 2017-02-26 (×3): 50 ug via INTRAVENOUS

## 2017-02-26 MED ORDER — ONDANSETRON HCL 4 MG/2ML IJ SOLN
INTRAMUSCULAR | Status: DC | PRN
  Administered 2017-02-26: 4 mg via INTRAVENOUS

## 2017-02-26 MED ORDER — MIDAZOLAM HCL 5 MG/5ML IJ SOLN
INTRAMUSCULAR | Status: DC | PRN
  Administered 2017-02-26: 2 mg via INTRAVENOUS

## 2017-02-26 MED ORDER — OXYCODONE-ACETAMINOPHEN 5-325 MG PO TABS: 1.0000 | ORAL_TABLET | ORAL | 0 refills | Status: DC | PRN

## 2017-02-26 MED ORDER — OXYCODONE HCL 5 MG PO TABS: 5.0000 mg | ORAL_TABLET | Freq: Once | ORAL | Status: DC | PRN

## 2017-02-26 MED ORDER — PROMETHAZINE HCL 25 MG/ML IJ SOLN: 6.2500 mg | INTRAMUSCULAR | Status: DC | PRN

## 2017-02-26 MED ORDER — DOCUSATE SODIUM 100 MG PO CAPS
100.0000 mg | ORAL_CAPSULE | Freq: Two times a day (BID) | ORAL | Status: DC
  Administered 2017-02-26 – 2017-02-27 (×3): 100 mg via ORAL
  Filled 2017-02-26 (×3): qty 1

## 2017-02-26 MED ORDER — POLYETHYLENE GLYCOL 3350 17 G PO PACK: 17.0000 g | PACK | Freq: Every day | ORAL | Status: DC | PRN

## 2017-02-26 MED ORDER — LIDOCAINE 2% (20 MG/ML) 5 ML SYRINGE
INTRAMUSCULAR | Status: AC
  Filled 2017-02-26: qty 5

## 2017-02-26 MED ORDER — ASPIRIN EC 325 MG PO TBEC
325.0000 mg | DELAYED_RELEASE_TABLET | Freq: Every day | ORAL | Status: DC
  Administered 2017-02-27: 325 mg via ORAL
  Filled 2017-02-26: qty 1

## 2017-02-26 MED ORDER — KETOROLAC TROMETHAMINE 15 MG/ML IJ SOLN
INTRAMUSCULAR | Status: AC
  Administered 2017-02-26: 15 mg
  Filled 2017-02-26: qty 1

## 2017-02-26 MED ORDER — HYDROMORPHONE HCL 1 MG/ML IJ SOLN: 1.0000 mg | INTRAMUSCULAR | Status: DC | PRN

## 2017-02-26 MED ORDER — SODIUM CHLORIDE 0.9 % IV SOLN
INTRAVENOUS | Status: DC
  Administered 2017-02-26 (×2): via INTRAVENOUS

## 2017-02-26 MED ORDER — METHOCARBAMOL 500 MG PO TABS
500.0000 mg | ORAL_TABLET | Freq: Four times a day (QID) | ORAL | Status: DC | PRN
  Administered 2017-02-27: 500 mg via ORAL
  Filled 2017-02-26: qty 1

## 2017-02-26 MED ORDER — HYDROCODONE-ACETAMINOPHEN 5-325 MG PO TABS: 1.0000 | ORAL_TABLET | ORAL | 0 refills | Status: DC | PRN

## 2017-02-26 MED ORDER — ONDANSETRON HCL 4 MG/2ML IJ SOLN: 4.0000 mg | Freq: Four times a day (QID) | INTRAMUSCULAR | Status: DC | PRN

## 2017-02-26 MED ORDER — ONDANSETRON HCL 4 MG/2ML IJ SOLN
INTRAMUSCULAR | Status: AC
  Filled 2017-02-26: qty 2

## 2017-02-26 MED ORDER — CEFAZOLIN SODIUM-DEXTROSE 2-4 GM/100ML-% IV SOLN
2.0000 g | INTRAVENOUS | Status: AC
  Administered 2017-02-26: 2 g via INTRAVENOUS
  Filled 2017-02-26: qty 100

## 2017-02-26 MED ORDER — PROPOFOL 10 MG/ML IV BOLUS
INTRAVENOUS | Status: AC
  Filled 2017-02-26: qty 20

## 2017-02-26 MED ORDER — FENTANYL CITRATE (PF) 250 MCG/5ML IJ SOLN
INTRAMUSCULAR | Status: AC
  Filled 2017-02-26: qty 5

## 2017-02-26 SURGICAL SUPPLY — 42 items
BANDAGE ESMARK 6X9 LF (GAUZE/BANDAGES/DRESSINGS) ×1 IMPLANT
BIT DRILL KIT 2.65MM (BIT) IMPLANT
BLADE SAW SGTL HD 18.5X60.5X1. (BLADE) ×1 IMPLANT
BLADE SURG 10 STRL SS (BLADE) IMPLANT
BNDG CMPR 9X6 STRL LF SNTH (GAUZE/BANDAGES/DRESSINGS) ×1
BNDG COHESIVE 4X5 TAN STRL (GAUZE/BANDAGES/DRESSINGS) ×1 IMPLANT
BNDG COHESIVE 6X5 TAN STRL LF (GAUZE/BANDAGES/DRESSINGS) ×2 IMPLANT
BNDG ESMARK 6X9 LF (GAUZE/BANDAGES/DRESSINGS) ×3
BNDG GAUZE ELAST 4 BULKY (GAUZE/BANDAGES/DRESSINGS) ×4 IMPLANT
COVER MAYO STAND STRL (DRAPES) IMPLANT
COVER SURGICAL LIGHT HANDLE (MISCELLANEOUS) ×4 IMPLANT
DRAPE OEC MINIVIEW 54X84 (DRAPES) IMPLANT
DRAPE U-SHAPE 47X51 STRL (DRAPES) ×3 IMPLANT
DRILL KIT 2.65MM (BIT) ×3
DRSG ADAPTIC 3X8 NADH LF (GAUZE/BANDAGES/DRESSINGS) ×3 IMPLANT
DURAPREP 26ML APPLICATOR (WOUND CARE) ×3 IMPLANT
ELECT REM PT RETURN 9FT ADLT (ELECTROSURGICAL) ×3
ELECTRODE REM PT RTRN 9FT ADLT (ELECTROSURGICAL) ×1 IMPLANT
GAUZE SPONGE 4X4 12PLY STRL (GAUZE/BANDAGES/DRESSINGS) ×3 IMPLANT
GLOVE BIOGEL PI IND STRL 9 (GLOVE) ×1 IMPLANT
GLOVE BIOGEL PI INDICATOR 9 (GLOVE) ×2
GLOVE SURG ORTHO 9.0 STRL STRW (GLOVE) ×3 IMPLANT
GOWN STRL REUS W/ TWL LRG LVL3 (GOWN DISPOSABLE) ×1 IMPLANT
GOWN STRL REUS W/ TWL XL LVL3 (GOWN DISPOSABLE) ×1 IMPLANT
GOWN STRL REUS W/TWL LRG LVL3 (GOWN DISPOSABLE) ×3
GOWN STRL REUS W/TWL XL LVL3 (GOWN DISPOSABLE) ×3
GUIDEWIRE THREADED 2.8 (WIRE) ×4 IMPLANT
IMPL SPEED TITAN 20X20X20 (Orthopedic Implant) IMPLANT
IMPLANT SPEED TITAN 20X20X20 (Orthopedic Implant) ×3 IMPLANT
KIT BASIN OR (CUSTOM PROCEDURE TRAY) ×3 IMPLANT
KIT ROOM TURNOVER OR (KITS) ×3 IMPLANT
NS IRRIG 1000ML POUR BTL (IV SOLUTION) ×3 IMPLANT
PACK ORTHO EXTREMITY (CUSTOM PROCEDURE TRAY) ×3 IMPLANT
PAD ARMBOARD 7.5X6 YLW CONV (MISCELLANEOUS) ×6 IMPLANT
PUTTY BONE DBX 5CC MIX (Putty) ×2 IMPLANT
SCREW HEADLESS 6.5X80X16MM (Screw) ×4 IMPLANT
SUCTION FRAZIER HANDLE 10FR (MISCELLANEOUS) ×2
SUCTION TUBE FRAZIER 10FR DISP (MISCELLANEOUS) ×1 IMPLANT
SUT ETHILON 2 0 PSLX (SUTURE) ×9 IMPLANT
TOWEL OR 17X26 10 PK STRL BLUE (TOWEL DISPOSABLE) ×3 IMPLANT
TUBE CONNECTING 12'X1/4 (SUCTIONS) ×1
TUBE CONNECTING 12X1/4 (SUCTIONS) ×2 IMPLANT

## 2017-02-26 NOTE — Op Note (Signed)
02/26/2017  8:40 AM  PATIENT:  Cameron Hood    PRE-OPERATIVE DIAGNOSIS:  Left Posterior Tibial Tendon Insufficiency   POST-OPERATIVE DIAGNOSIS:  Same  PROCEDURE:  LEFT SUBTALAR AND TALONAVICULAR FUSION  SURGEON:  Newt Minion, MD  PHYSICIAN ASSISTANT:None ANESTHESIA:   General  PREOPERATIVE INDICATIONS:  Cameron Hood is a  41 y.o. male with a diagnosis of Left Posterior Tibial Tendon Insufficiency  who failed conservative measures and elected for surgical management.    The risks benefits and alternatives were discussed with the patient preoperatively including but not limited to the risks of infection, bleeding, nerve injury, cardiopulmonary complications, the need for revision surgery, among others, and the patient was willing to proceed.  OPERATIVE IMPLANTS: 6.5 headless cannulated screws one staple and 5 cc of demineralized bone matrix with bone chips.  OPERATIVE FINDINGS: Subtalar arthrosis and talonavicular arthrosis.  OPERATIVE PROCEDURE: Patient was brought to operating room after undergoing a popliteal block he then underwent a general anesthetic.  After adequate levels of anesthesia were obtained patient's left lower extremity was prepped using DuraPrep draped into a sterile field a timeout was called.  An oblique incision was made over the sinus Tarsi this was carried down to the subtalar joint.  A bur was used to debride the subtalar joint back to bleeding viable subchondral bone.  The wound was irrigated and cleansed electrocautery was used for hemostasis.  Attention was then focused on the talonavicular joint.  A dorsal incision was made between the anterior tibial tendon and the EHL this was carried down to the talonavicular joint.  The joint was debrided of articular cartilage back to bleeding viable subchondral bone of the talonavicular joint with the bur.  The talonavicular joint was reduced and stabilized with a 20 x 20 mm staple Synthes.  2 guidewires then placed  from the calcaneus into the talus to use stabilize the subtalar joint.  The plantar patient's foot was held plantar-grade dorsiflexion to neutral slight valgus.  Using C-arm fluoroscopy to 80 mm screws were placed from the calcaneus the talus to stabilize the subtalar joint.  C-arm fluoroscopy verified alignment.  The wounds were irrigated with normal saline the fusion sites were packed with demineralized bone matrix and putty prior to compression.  The incisions were closed using 2-0 nylon.  A sterile compressive dressing was applied patient was extubated taken the PACU in stable condition.   DISCHARGE PLANNING:  Antibiotic duration: 23 hours postoperatively.  Weightbearing: Nonweightbearing on the left.  Pain medication: Prescription for Vicodin on the chart.  Plan for discharge after observation.  Dressing care/ Wound VAC: Dry dressing to remain in place until follow-up.  Patient states that he use his crutches at home.  Ambulatory devices: Crutches.  Discharge to: Discharge to home on Saturday morning.  Follow-up: In the office 1 week post operative.

## 2017-02-26 NOTE — Evaluation (Signed)
Physical Therapy Evaluation Patient Details Name: Cameron Hood MRN: [id number] DOB: Aug 30, 1975 Today's Date: 02/26/2017   History of Present Illness  Pt is a 41 y/o male s/p L ankle fusion. PMH includes sleep apnea and R ankle surgery. PMH includes sleep apnea and R ankle surgery.   Clinical Impression  Pt s/p surgery above with deficits below. PTA, pt was independent with functional mobility. Upon eval, pt presenting with decreased balance and decreased sensation in RLE from the knee down. Pt unsteady with gait with crutches and required min to min guard assist for steadying. Will need to determine steadiness with use of crutches for longer distances in next session to determine appropriate DME. Reports wife will be able to assist as needed at d/c. Reports MD did not recommend any follow up PT at this time. Anticipate he may need outpatient PT once cleared by MD. Will continue to follow acutely to maximize functional mobility independence and safety.     Follow Up Recommendations DC plan and follow up therapy as arranged by surgeon;Supervision for mobility/OOB    Equipment Recommendations  3in1 (PT)(Crutches vs RW )    Recommendations for Other Services       Precautions / Restrictions Precautions Precautions: Fall Required Braces or Orthoses: Other Brace/Splint Other Brace/Splint: LLE cam boot  Restrictions Weight Bearing Restrictions: Yes LLE Weight Bearing: Non weight bearing      Mobility  Bed Mobility Overal bed mobility: Modified Independent             General bed mobility comments: INcreased time, but no assist required.   Transfers Overall transfer level: Needs assistance Equipment used: Crutches Transfers: Sit to/from Stand Sit to Stand: Min assist         General transfer comment: Min A for steadying assist. Cues for NWB on LLE and to kick LLE out to maintain NWB.   Ambulation/Gait Ambulation/Gait assistance: Min assist;Min guard Ambulation Distance  (Feet): 5 Feet Assistive device: Crutches Gait Pattern/deviations: Step-to pattern Gait velocity: Decreased Gait velocity interpretation: Below normal speed for age/gender General Gait Details: Slow, unsteady gait with use of crutches. Required min to min guard assist for steadying throughout. Verbal cues for sequencing. Distance limited to chair, as pt wanting to eat dinner. Educated about safest DME recommendations, and educated that we would progress ambulation tomorrow to assess improvement in crutch use.   Stairs            Wheelchair Mobility    Modified Rankin (Stroke Patients Only)       Balance Overall balance assessment: Needs assistance Sitting-balance support: No upper extremity supported;Feet supported Sitting balance-Leahy Scale: Good     Standing balance support: Bilateral upper extremity supported;During functional activity Standing balance-Leahy Scale: Poor Standing balance comment: Reliant on BUE support                              Pertinent Vitals/Pain Pain Assessment: No/denies pain    Home Living Family/patient expects to be discharged to:: Private residence Living Arrangements: Spouse/significant other;Children Available Help at Discharge: Family;Available 24 hours/day Type of Home: House Home Access: Stairs to enter Entrance Stairs-Rails: None Entrance Stairs-Number of Steps: 1(threshold step ) Home Layout: Two level;Able to live on main level with bedroom/bathroom Home Equipment: None      Prior Function Level of Independence: Independent               Hand Dominance   Dominant Hand: Right  Extremity/Trunk Assessment   Upper Extremity Assessment Upper Extremity Assessment: Overall WFL for tasks assessed    Lower Extremity Assessment Lower Extremity Assessment: LLE deficits/detail LLE Deficits / Details: Numbness from the knee down. Pt in CAM boot as well.     Cervical / Trunk Assessment Cervical / Trunk  Assessment: Normal  Communication   Communication: No difficulties  Cognition Arousal/Alertness: Awake/alert Behavior During Therapy: WFL for tasks assessed/performed Overall Cognitive Status: Within Functional Limits for tasks assessed                                        General Comments General comments (skin integrity, edema, etc.): Pt's wife present during session. Educated about staying on first floor at home to increase safety.     Exercises     Assessment/Plan    PT Assessment Patient needs continued PT services  PT Problem List Decreased balance;Decreased mobility;Decreased knowledge of use of DME;Decreased knowledge of precautions;Impaired sensation       PT Treatment Interventions DME instruction;Gait training;Stair training;Functional mobility training;Therapeutic activities;Therapeutic exercise;Balance training;Neuromuscular re-education;Patient/family education    PT Goals (Current goals can be found in the Care Plan section)  Acute Rehab PT Goals Patient Stated Goal: to go home tomorrow  PT Goal Formulation: With patient Time For Goal Achievement: 03/05/17 Potential to Achieve Goals: Good    Frequency Min 5X/week   Barriers to discharge        Co-evaluation               AM-PAC PT "6 Clicks" Daily Activity  Outcome Measure Difficulty turning over in bed (including adjusting bedclothes, sheets and blankets)?: None Difficulty moving from lying on back to sitting on the side of the bed? : A Little Difficulty sitting down on and standing up from a chair with arms (e.g., wheelchair, bedside commode, etc,.)?: Unable Help needed moving to and from a bed to chair (including a wheelchair)?: A Little Help needed walking in hospital room?: A Little Help needed climbing 3-5 steps with a railing? : A Lot 6 Click Score: 16    End of Session Equipment Utilized During Treatment: Gait belt;Other (comment)(CAM boot LLE ) Activity Tolerance:  Patient tolerated treatment well Patient left: in chair;with call bell/phone within reach;with family/visitor present Nurse Communication: Mobility status PT Visit Diagnosis: Unsteadiness on feet (R26.81);Other abnormalities of gait and mobility (R26.89)    Time: 0352-4818 PT Time Calculation (min) (ACUTE ONLY): 23 min   Charges:   PT Evaluation $PT Eval Low Complexity: 1 Low PT Treatments $Gait Training: 8-22 mins   PT G Codes:   PT G-Codes **NOT FOR INPATIENT CLASS** Functional Assessment Tool Used: AM-PAC 6 Clicks Basic Mobility;Clinical judgement Functional Limitation: Mobility: Walking and moving around Mobility: Walking and Moving Around Current Status (H9093): At least 40 percent but less than 60 percent impaired, limited or restricted Mobility: Walking and Moving Around Goal Status [phone]): At least 1 percent but less than 20 percent impaired, limited or restricted    Leighton Ruff, PT, DPT  Acute Rehabilitation Services  Pager: [phone]   Rudean Hitt 02/26/2017, 6:21 PM

## 2017-02-26 NOTE — Anesthesia Procedure Notes (Addendum)
Anesthesia Regional Block: Popliteal block   Pre-Anesthetic Checklist: ,, timeout performed, Correct Patient, Correct Site, Correct Laterality, Correct Procedure, Correct Position, site marked, Risks and benefits discussed,  Surgical consent,  Pre-op evaluation,  At surgeon's request and post-op pain management  Laterality: Left  Prep: chloraprep       Needles:  Injection technique: Single-shot  Needle Type: Stimiplex     Needle Length: 9cm  Needle Gauge: 21     Additional Needles:   Procedures:,,,, ultrasound used (permanent image in chart),,,,  Narrative:  Start time: 02/26/2017 7:09 AM End time: 02/26/2017 7:14 AM Injection made incrementally with aspirations every 5 mL.  Performed by: Personally  Anesthesiologist: Lynda Rainwater, MD

## 2017-02-26 NOTE — H&P (Signed)
Cameron Hood is an 41 y.o. male.   Chief Complaint: Chronic pain with posterior tibial tendon insufficiency left foot HPI: Patient is a 41 year old gentleman who is status post subtalar and talonavicular fusion on the right for posterior tibial tendon insufficiency. Patient states he has developed the same symptoms on the left foot. He states that his right foot is completely asymptomatic after surgery. Patient states he works over 12 hours a day on his feet and has increasing pain in the left after prolonged standing. Patient has tried stiff soled running shoes with new balance and has tried orthotics and bracing without relief.     Past Medical History:  Diagnosis Date  . Disseminated Lyme disease   . GERD (gastroesophageal reflux disease)    occ tx w/ tums  . Low testosterone   . Lumbago     Past Surgical History:  Procedure Laterality Date  . ANKLE SURGERY Right     Family History  Problem Relation Age of Onset  . Hypertension Father   . Diabetes Father   . Anxiety disorder Mother   . Cancer Paternal Grandmother        unsure maybe pancreatic    Social History:  reports that  has never smoked. he has never used smokeless tobacco. He reports that he drinks alcohol. He reports that he does not use drugs.  Allergies:  Allergies  Allergen Reactions  . Mobic [Meloxicam] Other (See Comments)    Heart race    Medications Prior to Admission  Medication Sig Dispense Refill  . acetaminophen (TYLENOL) 500 MG tablet Take 1,000 mg by mouth every 6 (six) hours as needed for moderate pain or headache.    . ANDROGEL PUMP 20.25 MG/ACT (1.62%) GEL Apply 4 Pump topically daily.     Marland Kitchen loratadine (CLARITIN) 10 MG tablet Take 10 mg by mouth daily.    Marland Kitchen MEGARED OMEGA-3 KRILL OIL 500 MG CAPS Take 1 capsule by mouth daily. (Patient taking differently: Take 500 mg by mouth daily. ) 30 capsule 0  . Multiple Vitamins-Minerals (MULTIVITAMIN WITH MINERALS) tablet Take 1 tablet by mouth daily.     . naproxen sodium (ALEVE) 220 MG tablet Take 220 mg by mouth 2 (two) times daily as needed (for pain or headache).    . triamcinolone cream (KENALOG) 0.1 % Apply 1 application topically 2 (two) times daily. (Patient taking differently: Apply 1 application topically daily. ) 453.6 g 0  . ketoconazole (NIZORAL) 2 % cream Apply 1 application topically daily. (Patient not taking: Reported on 02/18/2017) 30 g 0    Results for orders placed or performed during the hospital encounter of 02/24/17 (from the past 48 hour(s))  Surgical pcr screen     Status: Abnormal   Collection Time: 02/24/17  8:35 AM  Result Value Ref Range   MRSA, PCR NEGATIVE NEGATIVE   Staphylococcus aureus POSITIVE (A) NEGATIVE    Comment: (NOTE) The Xpert SA Assay (FDA approved for NASAL specimens in patients 64 years of age and older), is one component of a comprehensive surveillance program. It is not intended to diagnose infection nor to guide or monitor treatment.   CBC     Status: None   Collection Time: 02/24/17  8:35 AM  Result Value Ref Range   WBC 6.3 4.0 - 10.5 K/uL   RBC 5.37 4.22 - 5.81 MIL/uL   Hemoglobin 15.4 13.0 - 17.0 g/dL   HCT 46.1 39.0 - 52.0 %   MCV 85.8 78.0 - 100.0 fL  MCH 28.7 26.0 - 34.0 pg   MCHC 33.4 30.0 - 36.0 g/dL   RDW 13.8 11.5 - 15.5 %   Platelets 258 150 - 400 K/uL   No results found.  Review of Systems  All other systems reviewed and are negative.   Blood pressure 127/85, pulse 67, temperature 98 F (36.7 C), temperature source Oral, resp. rate 20, SpO2 98 %. Physical Exam   Patient is alert, oriented, no adenopathy, well-dressed, normal affect, normal respiratory effort. Examination patient has a good dorsalis pedis pulse he does have subtalar motion but this is painful. He is tender to palpation in the sinus Tarsi he is tender to palpation along the posterior tibial tendon. He has pronation and valgus of the forefoot.  He has pain with attempted single limb heel raise is  painful along the posterior tibial tendon on the left.    Assessment/Plan Assessment: Posterior tibial tendon insufficiency in the left failed conservative treatment.  Plan: We will plan for talonavicular and subtalar fusion.  Risk and benefits were discussed including pain stiffness infection DVT need for additional surgery.  Patient states he understands wishes to proceed at this time.  Newt Minion, MD 02/26/2017, 6:53 AM

## 2017-02-26 NOTE — Transfer of Care (Signed)
Immediate Anesthesia Transfer of Care Note  Patient: DIAR BERKEL  Procedure(s) Performed: LEFT SUBTALAR AND TALONAVICULAR FUSION (Left )  Patient Location: PACU  Anesthesia Type:General  Level of Consciousness: awake, alert  and oriented  Airway & Oxygen Therapy: Patient Spontanous Breathing and Patient connected to face mask oxygen  Post-op Assessment: Report given to RN and Post -op Vital signs reviewed and stable  Post vital signs: Reviewed and stable  Last Vitals:  Vitals:   02/26/17 0545 02/26/17 0839  BP: 127/85 110/74  Pulse: 67 70  Resp: 20 (!) 8  Temp: 36.7 C 36.5 C  SpO2: 98% 100%    Last Pain:  Vitals:   02/26/17 0545  TempSrc: Oral         Complications: No apparent anesthesia complications

## 2017-02-26 NOTE — Anesthesia Procedure Notes (Signed)
Procedure Name: LMA Insertion Date/Time: 02/26/2017 7:34 AM Performed by: Babs Bertin, CRNA Pre-anesthesia Checklist: Patient identified, Emergency Drugs available, Suction available and Patient being monitored Patient Re-evaluated:Patient Re-evaluated prior to induction Oxygen Delivery Method: Circle System Utilized Preoxygenation: Pre-oxygenation with 100% oxygen Induction Type: IV induction Ventilation: Mask ventilation without difficulty LMA: LMA inserted LMA Size: 5.0 Number of attempts: 1 Airway Equipment and Method: Bite block Placement Confirmation: positive ETCO2 Tube secured with: Tape Dental Injury: Teeth and Oropharynx as per pre-operative assessment

## 2017-02-26 NOTE — Progress Notes (Signed)
Orthopedic Tech Progress Note Patient Details:  Cameron Hood Aug 18, 1975 [id number]  Ortho Devices Type of Ortho Device: CAM walker Ortho Device/Splint Interventions: Application   Post Interventions Patient Tolerated: Well Instructions Provided: Care of device   Cameron Hood 02/26/2017, 9:58 AM

## 2017-02-26 NOTE — Anesthesia Postprocedure Evaluation (Signed)
Anesthesia Post Note  Patient: Cameron Hood  Procedure(s) Performed: LEFT SUBTALAR AND TALONAVICULAR FUSION (Left )     Patient location during evaluation: PACU Anesthesia Type: General Level of consciousness: awake and alert Pain management: pain level controlled Vital Signs Assessment: post-procedure vital signs reviewed and stable Respiratory status: spontaneous breathing, nonlabored ventilation and respiratory function stable Cardiovascular status: blood pressure returned to baseline and stable Postop Assessment: no apparent nausea or vomiting Anesthetic complications: no    Last Vitals:  Vitals:   02/26/17 0940 02/26/17 1012  BP: 124/84 120/75  Pulse: (!) 57 (!) 55  Resp: 16 16  Temp: (!) 36.3 C 36.6 C  SpO2: 98% 98%    Last Pain:  Vitals:   02/26/17 1023  TempSrc:   PainSc: 2                  Lynda Rainwater

## 2017-02-26 NOTE — Anesthesia Preprocedure Evaluation (Signed)
Anesthesia Evaluation  Patient identified by MRN, date of birth, ID band Patient awake    Reviewed: Allergy & Precautions, NPO status , Patient's Chart, lab work & pertinent test results  Airway Mallampati: II  TM Distance: >3 FB Neck ROM: Full    Dental no notable dental hx.    Pulmonary neg pulmonary ROS, sleep apnea ,    Pulmonary exam normal breath sounds clear to auscultation       Cardiovascular negative cardio ROS Normal cardiovascular exam Rhythm:Regular Rate:Normal     Neuro/Psych negative neurological ROS  negative psych ROS   GI/Hepatic negative GI ROS, Neg liver ROS, GERD  ,  Endo/Other  negative endocrine ROSHypothyroidism   Renal/GU negative Renal ROS  negative genitourinary   Musculoskeletal negative musculoskeletal ROS (+)   Abdominal (+) + obese,   Peds negative pediatric ROS (+)  Hematology negative hematology ROS (+)   Anesthesia Other Findings   Reproductive/Obstetrics negative OB ROS                             Anesthesia Physical Anesthesia Plan  ASA: II  Anesthesia Plan: General   Post-op Pain Management: GA combined w/ Regional for post-op pain   Induction: Intravenous  PONV Risk Score and Plan: 2 and Ondansetron and Midazolam  Airway Management Planned: LMA  Additional Equipment:   Intra-op Plan:   Post-operative Plan: Extubation in OR  Informed Consent: I have reviewed the patients History and Physical, chart, labs and discussed the procedure including the risks, benefits and alternatives for the proposed anesthesia with the patient or authorized representative who has indicated his/her understanding and acceptance.   Dental advisory given  Plan Discussed with: CRNA  Anesthesia Plan Comments:         Anesthesia Quick Evaluation

## 2017-02-27 DIAGNOSIS — M76822 Posterior tibial tendinitis, left leg: Secondary | ICD-10-CM | POA: Diagnosis not present

## 2017-02-27 NOTE — Care Management Note (Signed)
Case Management Note  Patient Details  Name: Cameron Hood MRN: [id number] Date of Birth: 07-02-1975  Subjective/Objective:                 Spoke with patient and wife at bedside. They have secured crutches for home use, declined 3/1. No other CM needs identified.    Action/Plan:  DC to home.   Expected Discharge Date:  02/27/17               Expected Discharge Plan:  Home/Self Care  In-House Referral:     Discharge planning Services  CM Consult  Post Acute Care Choice:    Choice offered to:     DME Arranged:    DME Agency:     HH Arranged:    HH Agency:     Status of Service:  Completed, signed off  If discussed at [employer] of Stay Meetings, dates discussed:    Additional Comments:  Carles Collet, RN 02/27/2017, 9:36 AM

## 2017-02-27 NOTE — Progress Notes (Signed)
Cameron Hood to be D/C'd Home per MD order.  Discussed prescriptions and follow up appointments with the patient. Prescriptions given to patient, medication list explained in detail. Pt verbalized understanding.  Allergies as of 02/27/2017      Reactions   Mobic [meloxicam] Other (See Comments)   Heart race      Medication List    TAKE these medications   acetaminophen 500 MG tablet Commonly known as:  TYLENOL Take 1,000 mg by mouth every 6 (six) hours as needed for moderate pain or headache.   ANDROGEL PUMP 20.25 MG/ACT (1.62%) Gel Generic drug:  Testosterone Apply 4 Pump topically daily.   HYDROcodone-acetaminophen 5-325 MG tablet Commonly known as:  NORCO/VICODIN Take 1-2 tablets by mouth every 4 (four) hours as needed for moderate pain.   ketoconazole 2 % cream Commonly known as:  NIZORAL Apply 1 application topically daily.   loratadine 10 MG tablet Commonly known as:  CLARITIN Take 10 mg by mouth daily.   MEGARED OMEGA-3 KRILL OIL 500 MG Caps Take 1 capsule by mouth daily. What changed:  how much to take   multivitamin with minerals tablet Take 1 tablet by mouth daily.   naproxen sodium 220 MG tablet Commonly known as:  ALEVE Take 220 mg by mouth 2 (two) times daily as needed (for pain or headache).   oxyCODONE-acetaminophen 5-325 MG tablet Commonly known as:  PERCOCET/ROXICET Take 1 tablet by mouth every 4 (four) hours as needed for severe pain.   triamcinolone cream 0.1 % Commonly known as:  KENALOG Apply 1 application topically 2 (two) times daily. What changed:  when to take this            Discharge Care Instructions  (From admission, onward)        Start     Ordered   02/26/17 0000  Non weight bearing    Question Answer Comment  Laterality left   Extremity Lower      02/26/17 0839      Vitals:   02/27/17 0500 02/27/17 0831  BP: 118/62 124/66  Pulse: (!) 55 (!) 59  Resp: 15 15  Temp: 97.7 F (36.5 C) 97.7 F (36.5 C)  SpO2:  95% 98%    Skin clean, dry and intact without evidence of skin break down, no evidence of skin tears noted. Compression wrap is clean, dry, and intact on left foot/ankle with old drainage marked. IV catheter discontinued intact. Site without signs and symptoms of complications. Dressing and pressure applied. Pt denies pain at this time. No complaints noted.  An After Visit Summary and prescriptions were printed and given to the patient. Patient escorted via [place], and D/C home via private auto.  [place] RN

## 2017-02-27 NOTE — Progress Notes (Signed)
Physical Therapy Treatment Patient Details Name: Cameron Hood MRN: [id number] DOB: [birth year] Today's Date: 02/27/2017    History of Present Illness Pt is a 41 y/o male s/p L ankle fusion. PMH includes sleep apnea and R ankle surgery. PMH includes sleep apnea and R ankle surgery.     PT Comments    Pt progressing well with mobility. Was able to demonstrate safe mechanics with crutches and completed stair training at a min guard level. Pt will have adequate support at home, and has crutches and a BSC available to him. Will continue to follow and progress as able per POC.   Follow Up Recommendations  DC plan and follow up therapy as arranged by surgeon;Supervision for mobility/OOB     Equipment Recommendations  None recommended by PT    Recommendations for Other Services       Precautions / Restrictions Precautions Precautions: Fall Required Braces or Orthoses: Other Brace/Splint Other Brace/Splint: LLE cam boot  Restrictions Weight Bearing Restrictions: Yes LLE Weight Bearing: Non weight bearing    Mobility  Bed Mobility Overal bed mobility: Modified Independent             General bed mobility comments: Increased time, but no assist required.   Transfers Overall transfer level: Needs assistance Equipment used: Crutches Transfers: Sit to/from Stand Sit to Stand: Supervision         General transfer comment: Pt able to maintain NWB well. Powered up to full stand without assistance.   Ambulation/Gait Ambulation/Gait assistance: Supervision Ambulation Distance (Feet): 100 Feet Assistive device: Crutches Gait Pattern/deviations: Step-to pattern Gait velocity: Decreased Gait velocity interpretation: Below normal speed for age/gender General Gait Details: Pt negotiating with crutches well. 2 very mild instances of unsteadiness however able to recover independently. Pt ambulated ~100 feet without difficulty.    Stairs Stairs: Yes   Stair Management: No  rails;Forwards;With crutches Number of Stairs: 1(x2) General stair comments: VC's for sequencing and general safety. Pt was able to complete 1 step x2.   Wheelchair Mobility    Modified Rankin (Stroke Patients Only)       Balance Overall balance assessment: Needs assistance Sitting-balance support: No upper extremity supported;Feet supported Sitting balance-Leahy Scale: Good     Standing balance support: Bilateral upper extremity supported;During functional activity Standing balance-Leahy Scale: Poor Standing balance comment: Reliant on BUE support                             Cognition Arousal/Alertness: Awake/alert Behavior During Therapy: WFL for tasks assessed/performed Overall Cognitive Status: Within Functional Limits for tasks assessed                                        Exercises      General Comments        Pertinent Vitals/Pain Pain Assessment: Faces Faces Pain Scale: Hurts a little bit Pain Location: Ankle - pt reports block is starting to wear off Pain Descriptors / Indicators: Operative site guarding;Tingling Pain Intervention(s): Limited activity within patient's tolerance;Monitored during session;Repositioned    Home Living                      Prior Function            PT Goals (current goals can now be found in the care plan section) Acute Rehab PT Goals Patient  Stated Goal: to go home today PT Goal Formulation: With patient Time For Goal Achievement: 03/05/17 Potential to Achieve Goals: Good Progress towards PT goals: Progressing toward goals    Frequency    Min 5X/week      PT Plan Current plan remains appropriate    Co-evaluation              AM-PAC PT "6 Clicks" Daily Activity  Outcome Measure  Difficulty turning over in bed (including adjusting bedclothes, sheets and blankets)?: None Difficulty moving from lying on back to sitting on the side of the bed? : A Little Difficulty  sitting down on and standing up from a chair with arms (e.g., wheelchair, bedside commode, etc,.)?: A Little Help needed moving to and from a bed to chair (including a wheelchair)?: A Little Help needed walking in hospital room?: A Little Help needed climbing 3-5 steps with a railing? : A Little 6 Click Score: 19    End of Session Equipment Utilized During Treatment: Gait belt;Other (comment)(CAM boot LLE ) Activity Tolerance: Patient tolerated treatment well Patient left: in chair;with call bell/phone within reach;with family/visitor present Nurse Communication: Mobility status PT Visit Diagnosis: Unsteadiness on feet (R26.81);Other abnormalities of gait and mobility (R26.89)     Time: 9326-7124 PT Time Calculation (min) (ACUTE ONLY): 28 min  Charges:  $Gait Training: 23-37 mins                    G Codes:       Rolinda Roan, PT, DPT Acute Rehabilitation Services Pager: [phone]    Thelma Comp 02/27/2017, 11:41 AM

## 2017-02-27 NOTE — Discharge Summary (Signed)
Patient ID: Cameron Hood MRN: [id number] DOB/AGE: 43/09/1975 43 y.o.  Admit date: 02/26/2017 Discharge date: 02/27/2017  Admission Diagnoses:  Active Problems:   Arthritis of left subtalar joint   Discharge Diagnoses:  Same  Past Medical History:  Diagnosis Date  . Disseminated Lyme disease   . GERD (gastroesophageal reflux disease)    occ tx w/ tums  . Low testosterone   . Lumbago     Surgeries: Procedure(s): LEFT SUBTALAR AND TALONAVICULAR FUSION on 02/26/2017   Consultants:   Discharged Condition: Improved  Hospital Course: Cameron Hood is an 41 y.o. male who was admitted 02/26/2017 for operative treatment of<principal problem not specified>. Patient has severe unremitting pain that affects sleep, daily activities, and work/hobbies. After pre-op clearance the patient was taken to the operating room on 02/26/2017 and underwent  Procedure(s): LEFT SUBTALAR AND TALONAVICULAR FUSION.    Patient was given perioperative antibiotics:  Anti-infectives (From admission, onward)   Start     Dose/Rate Route Frequency Ordered Stop   02/26/17 0945  ceFAZolin (ANCEF) IVPB 2g/100 mL premix     2 g 200 mL/hr over 30 Minutes Intravenous Every 6 hours 02/26/17 0933 02/27/17 0249   02/26/17 0550  ceFAZolin (ANCEF) IVPB 2g/100 mL premix     2 g 200 mL/hr over 30 Minutes Intravenous On call to O.R. 02/26/17 0550 02/26/17 0805       Patient was given sequential compression devices, early ambulation, and chemoprophylaxis to prevent DVT.  Patient benefited maximally from hospital stay and there were no complications.    Recent vital signs:  Patient Vitals for the past 24 hrs:  BP Temp Temp src Pulse Resp SpO2  02/27/17 0831 124/66 97.7 F (36.5 C) Oral (!) 59 15 98 %  02/27/17 0500 118/62 97.7 F (36.5 C) Oral (!) 55 15 95 %  02/26/17 2354 (!) 110/59 97.8 F (36.6 C) Oral (!) 57 16 95 %  02/26/17 2020 119/67 98.6 F (37 C) Oral 64 18 95 %  02/26/17 1012 120/75 97.8 F (36.6  C) Oral (!) 55 16 98 %  02/26/17 0940 124/84 (!) 97.3 F (36.3 C) - (!) 57 16 98 %  02/26/17 0924 126/85 - - (!) 59 13 100 %  02/26/17 0909 123/79 - - (!) 58 14 98 %     Recent laboratory studies: No results for input(s): WBC, HGB, HCT, PLT, NA, K, CL, CO2, BUN, CREATININE, GLUCOSE, INR, CALCIUM in the last 72 hours.  Invalid input(s): PT, 2   Discharge Medications:   Allergies as of 02/27/2017      Reactions   Mobic [meloxicam] Other (See Comments)   Heart race      Medication List    TAKE these medications   acetaminophen 500 MG tablet Commonly known as:  TYLENOL Take 1,000 mg by mouth every 6 (six) hours as needed for moderate pain or headache.   ANDROGEL PUMP 20.25 MG/ACT (1.62%) Gel Generic drug:  Testosterone Apply 4 Pump topically daily.   HYDROcodone-acetaminophen 5-325 MG tablet Commonly known as:  NORCO/VICODIN Take 1-2 tablets by mouth every 4 (four) hours as needed for moderate pain.   ketoconazole 2 % cream Commonly known as:  NIZORAL Apply 1 application topically daily.   loratadine 10 MG tablet Commonly known as:  CLARITIN Take 10 mg by mouth daily.   MEGARED OMEGA-3 KRILL OIL 500 MG Caps Take 1 capsule by mouth daily. What changed:  how much to take   multivitamin with minerals tablet Take  1 tablet by mouth daily.   naproxen sodium 220 MG tablet Commonly known as:  ALEVE Take 220 mg by mouth 2 (two) times daily as needed (for pain or headache).   oxyCODONE-acetaminophen 5-325 MG tablet Commonly known as:  PERCOCET/ROXICET Take 1 tablet by mouth every 4 (four) hours as needed for severe pain.   triamcinolone cream 0.1 % Commonly known as:  KENALOG Apply 1 application topically 2 (two) times daily. What changed:  when to take this            Discharge Care Instructions  (From admission, onward)        Start     Ordered   02/26/17 0000  Non weight bearing    Question Answer Comment  Laterality left   Extremity Lower       02/26/17 0839      Diagnostic Studies: No results found.  Disposition: to home  Discharge Instructions    Call MD / Call 911   Complete by:  As directed    If you experience chest pain or shortness of breath, CALL 911 and be transported to the hospital emergency room.  If you develope a fever above 101 F, pus (white drainage) or increased drainage or redness at the wound, or calf pain, call your surgeon's office.   Constipation Prevention   Complete by:  As directed    Drink plenty of fluids.  Prune juice may be helpful.  You may use a stool softener, such as Colace (over the counter) 100 mg twice a day.  Use MiraLax (over the counter) for constipation as needed.   Diet - low sodium heart healthy   Complete by:  As directed    Elevate operative extremity   Complete by:  As directed    Increase activity slowly as tolerated   Complete by:  As directed    Non weight bearing   Complete by:  As directed    Laterality:  left   Extremity:  Lower      Follow-up Information    Newt Minion, MD Follow up in 1 week(s).   Specialty:  Orthopedic Surgery Contact information: [place] [address] [phone]            Signed: [name] 02/27/2017, 9:03 AM

## 2017-02-27 NOTE — Progress Notes (Signed)
Patient ID: Cameron Hood, male   DOB: Jul 10, 1975, 41 y.o.   MRN: [id number] Doing well overall.  Left foot well-perfused and dressing is clean, dry, and intact.  His block is still in effect with foot numbness.  Compartments are soft.  Can be discharged to home today.

## 2017-02-27 NOTE — Discharge Instructions (Signed)
Keep your dressing clean and dry. Ice and elevation as needed for swelling. You can rest out of your boot. No weight at all on your left foot.

## 2017-03-01 ENCOUNTER — Telehealth: Payer: Self-pay

## 2017-03-01 ENCOUNTER — Encounter (HOSPITAL_COMMUNITY): Payer: Self-pay | Admitting: Orthopedic Surgery

## 2017-03-01 NOTE — Telephone Encounter (Signed)
Patient had ankle surgery overnight at [facility] and is recovering at home with tons of rest. Patient states his ankle has been swollen but taking medication prescribed to him along with rest with ice on ankle it helping relieve his symptoms. He is going to call Orthopedic to schedule a follow up on Wednesday when they are open.

## 2017-03-03 NOTE — Telephone Encounter (Signed)
Documentation reviewed, was he able to schedule follow up appt?

## 2017-03-04 NOTE — Telephone Encounter (Signed)
Patient has a follow up with Orthopedics on Monday March 08, 2017 at 11:15 a.m.

## 2017-03-04 NOTE — Addendum Note (Signed)
Addendum  created 03/04/17 0726 by Lynda Rainwater, MD   Intraprocedure Blocks edited, Sign clinical note

## 2017-03-08 ENCOUNTER — Encounter (INDEPENDENT_AMBULATORY_CARE_PROVIDER_SITE_OTHER): Payer: Self-pay | Admitting: Orthopedic Surgery

## 2017-03-08 ENCOUNTER — Ambulatory Visit (INDEPENDENT_AMBULATORY_CARE_PROVIDER_SITE_OTHER): Payer: 59 | Admitting: Orthopedic Surgery

## 2017-03-08 ENCOUNTER — Ambulatory Visit (INDEPENDENT_AMBULATORY_CARE_PROVIDER_SITE_OTHER): Payer: Self-pay

## 2017-03-08 DIAGNOSIS — M19072 Primary osteoarthritis, left ankle and foot: Secondary | ICD-10-CM | POA: Diagnosis not present

## 2017-03-08 NOTE — Progress Notes (Signed)
   Post-Op Visit Note   Patient: Cameron Hood           Date of Birth: 08-01-1975           MRN: [id number] Visit Date: 03/08/2017 PCP: Steele Sizer, MD  Chief Complaint: No chief complaint on file.   HPI:  HPI Patient is a 42 year old gentleman seen 1 week status post left subtalar and talonavicular fusion.   Ortho Exam Incisions are clean dry and intact x 3. Sutures in place.   Visit Diagnoses:  1. Arthritis of left subtalar joint     Plan: continue nonweight bearing with CAM. Begin daily dial soap cleansing. Dry dressings. Follow up in 1 week for suture removal. In 3 weeks with repeat radiographs.  Follow-Up Instructions: No Follow-up on file.   Imaging: No results found.  Orders:  Orders Placed This Encounter  Procedures  . XR Ankle Complete Left   No orders of the defined types were placed in this encounter.    PMFS History: Patient Active Problem List   Diagnosis Date Noted  . Arthritis of left subtalar joint   . Dermatitis 12/07/2016  . Strabismus 12/04/2015  . History of Lyme disease 10/31/2014  . OSA (obstructive sleep apnea) 09/19/2014  . Obesity (BMI 30.0-34.9) 09/19/2014  . Thyroid activity decreased 09/19/2014  . Hypogonadism in male 09/19/2014   Past Medical History:  Diagnosis Date  . Disseminated Lyme disease   . GERD (gastroesophageal reflux disease)    occ tx w/ tums  . Low testosterone   . Lumbago     Family History  Problem Relation Age of Onset  . Hypertension Father   . Diabetes Father   . Anxiety disorder Mother   . Cancer Paternal Grandmother        unsure maybe pancreatic     Past Surgical History:  Procedure Laterality Date  . ANKLE FUSION Left 02/26/2017   Procedure: LEFT SUBTALAR AND TALONAVICULAR FUSION;  Surgeon: Newt Minion, MD;  Location: [place];  Service: Orthopedics;  Laterality: Left;  . ANKLE SURGERY Right   . FUSION OF TALONAVICULAR JOINT Left 02/26/2017   Social History   Occupational History  .  Occupation: Dealer  Tobacco Use  . Smoking status: Never Smoker  . Smokeless tobacco: Never Used  Substance and Sexual Activity  . Alcohol use: Yes    Alcohol/week: 0.0 oz    Comment: rarely  . Drug use: No  . Sexual activity: Yes    Partners: Female

## 2017-03-18 ENCOUNTER — Encounter (INDEPENDENT_AMBULATORY_CARE_PROVIDER_SITE_OTHER): Payer: Self-pay | Admitting: Orthopedic Surgery

## 2017-03-18 ENCOUNTER — Ambulatory Visit (INDEPENDENT_AMBULATORY_CARE_PROVIDER_SITE_OTHER): Payer: Managed Care, Other (non HMO) | Admitting: Family

## 2017-03-18 ENCOUNTER — Ambulatory Visit (INDEPENDENT_AMBULATORY_CARE_PROVIDER_SITE_OTHER): Payer: Managed Care, Other (non HMO)

## 2017-03-18 DIAGNOSIS — M19072 Primary osteoarthritis, left ankle and foot: Secondary | ICD-10-CM

## 2017-03-18 NOTE — Progress Notes (Signed)
   Post-Op Visit Note   Patient: Cameron Hood           Date of Birth: 1975/03/24           MRN: [id number] Visit Date: 03/18/2017 PCP: Steele Sizer, MD  Chief Complaint:  Chief Complaint  Patient presents with  . Left Ankle - Routine Post Op    HPI:  HPI Patient is a 42 year old gentleman seen status post left subtalar and talonavicular fusion.   Ortho Exam Incisions are clean dry and intact x 3. Well healed. Sutures in place. No erythema, drainage or sign of infection.  Visit Diagnoses:  1. Arthritis of left subtalar joint     Plan: continue nonweight bearing with CAM. Continue daily dial soap cleansing. Dry dressings. Follow up in 2-3 more weeks with repeat radiographs. Anticipate advancing to weight bearing as tolerated at that time.  Follow-Up Instructions: Return in about 2 weeks (around 04/01/2017).   Imaging: No results found.  Orders:  Orders Placed This Encounter  Procedures  . XR Ankle Complete Left   No orders of the defined types were placed in this encounter.    PMFS History: Patient Active Problem List   Diagnosis Date Noted  . Arthritis of left subtalar joint   . Dermatitis 12/07/2016  . Strabismus 12/04/2015  . History of Lyme disease 10/31/2014  . OSA (obstructive sleep apnea) 09/19/2014  . Obesity (BMI 30.0-34.9) 09/19/2014  . Thyroid activity decreased 09/19/2014  . Hypogonadism in male 09/19/2014   Past Medical History:  Diagnosis Date  . Disseminated Lyme disease   . GERD (gastroesophageal reflux disease)    occ tx w/ tums  . Low testosterone   . Lumbago     Family History  Problem Relation Age of Onset  . Hypertension Father   . Diabetes Father   . Anxiety disorder Mother   . Cancer Paternal Grandmother        unsure maybe pancreatic     Past Surgical History:  Procedure Laterality Date  . ANKLE FUSION Left 02/26/2017   Procedure: LEFT SUBTALAR AND TALONAVICULAR FUSION;  Surgeon: Newt Minion, MD;  Location: [place];   Service: Orthopedics;  Laterality: Left;  . ANKLE SURGERY Right   . FUSION OF TALONAVICULAR JOINT Left 02/26/2017   Social History   Occupational History  . Occupation: Dealer  Tobacco Use  . Smoking status: Never Smoker  . Smokeless tobacco: Never Used  Substance and Sexual Activity  . Alcohol use: Yes    Alcohol/week: 0.0 oz    Comment: rarely  . Drug use: No  . Sexual activity: Yes    Partners: Female

## 2017-03-23 ENCOUNTER — Encounter (INDEPENDENT_AMBULATORY_CARE_PROVIDER_SITE_OTHER): Payer: Self-pay | Admitting: Family

## 2017-04-01 ENCOUNTER — Ambulatory Visit (INDEPENDENT_AMBULATORY_CARE_PROVIDER_SITE_OTHER): Payer: Managed Care, Other (non HMO)

## 2017-04-01 ENCOUNTER — Encounter (INDEPENDENT_AMBULATORY_CARE_PROVIDER_SITE_OTHER): Payer: Self-pay | Admitting: Family

## 2017-04-01 ENCOUNTER — Ambulatory Visit (INDEPENDENT_AMBULATORY_CARE_PROVIDER_SITE_OTHER): Payer: Managed Care, Other (non HMO) | Admitting: Orthopedic Surgery

## 2017-04-01 VITALS — Ht 70.0 in | Wt 234.0 lb

## 2017-04-01 DIAGNOSIS — M19072 Primary osteoarthritis, left ankle and foot: Secondary | ICD-10-CM

## 2017-04-01 DIAGNOSIS — M25572 Pain in left ankle and joints of left foot: Secondary | ICD-10-CM

## 2017-04-01 NOTE — Progress Notes (Signed)
Office Visit Note   Patient: Cameron Hood           Date of Birth: 25-May-1975           MRN: [id number] Visit Date: 04/01/2017              Requested by: Steele Sizer, [address] [place], [place] [address] PCP: Steele Sizer, MD  Chief Complaint  Patient presents with  . Left Ankle - Routine Post Op    02/26/17 left subtalar and talonavicular fusion       HPI: Patient is 4 weeks status post left subtalar and talonavicular fusion.  Patient is strict nonweightbearing in a cam boot he states he feels well he states that this feels like it is healing much better than his previous surgeries he states he has noticed some skin peeling.  Assessment & Plan: Visit Diagnoses:  1. Pain in left ankle and joints of left foot   2. Arthritis of left subtalar joint     Plan: Recommended knee-high compression stockings and elevation to decrease the swelling.  Patient will advance to a sneaker with custom orthotics and continue crutches for protected weightbearing on the left.  Follow-up in 1 month with repeat three-view radiographs of the left ankle at which time anticipate he could be released.  FMLA paperwork was completed anticipate out of work for 3 months from the time of surgery patient is to be nonweightbearing until the fusion has healed.  Follow-Up Instructions: Return in about 4 weeks (around 04/29/2017).   Ortho Exam  Patient is alert, oriented, no adenopathy, well-dressed, normal affect, normal respiratory effort. Examination patient does have some swelling he has peeling skin secondary to the swelling there is no redness no cellulitis no signs of infection.  He does have a little numbness around the surgical incisions.  His only complaint of pain is in the plantar fascia.  Imaging: Xr Ankle Complete Left  Result Date: 04/01/2017 3 view radiographs of the left foot shows stable alignment of the subtalar and talonavicular fusion there are no hardware  failure is no complicating features.  No images are attached to the encounter.  Labs: Lab Results  Component Value Date   HGBA1C 5.3 12/07/2016   HGBA1C 5.1 12/04/2015    @LABSALLVALUES (HGBA1)@  Body mass index is 33.58 kg/m.  Orders:  Orders Placed This Encounter  Procedures  . XR Ankle Complete Left   No orders of the defined types were placed in this encounter.    Procedures: No procedures performed  Clinical Data: No additional findings.  ROS:  All other systems negative, except as noted in the HPI. Review of Systems  Objective: Vital Signs: Ht 5' 10"  (1.778 m)   Wt 234 lb (106.1 kg)   BMI 33.58 kg/m   Specialty Comments:  No specialty comments available.  PMFS History: Patient Active Problem List   Diagnosis Date Noted  . Arthritis of left subtalar joint   . Dermatitis 12/07/2016  . Strabismus 12/04/2015  . History of Lyme disease 10/31/2014  . OSA (obstructive sleep apnea) 09/19/2014  . Obesity (BMI 30.0-34.9) 09/19/2014  . Thyroid activity decreased 09/19/2014  . Hypogonadism in male 09/19/2014   Past Medical History:  Diagnosis Date  . Disseminated Lyme disease   . GERD (gastroesophageal reflux disease)    occ tx w/ tums  . Low testosterone   . Lumbago     Family History  Problem Relation Age of Onset  . Hypertension Father   .  Diabetes Father   . Anxiety disorder Mother   . Cancer Paternal Grandmother        unsure maybe pancreatic     Past Surgical History:  Procedure Laterality Date  . ANKLE FUSION Left 02/26/2017   Procedure: LEFT SUBTALAR AND TALONAVICULAR FUSION;  Surgeon: Newt Minion, MD;  Location: [place];  Service: Orthopedics;  Laterality: Left;  . ANKLE SURGERY Right   . FUSION OF TALONAVICULAR JOINT Left 02/26/2017   Social History   Occupational History  . Occupation: Dealer  Tobacco Use  . Smoking status: Never Smoker  . Smokeless tobacco: Never Used  Substance and Sexual Activity  . Alcohol use: Yes     Alcohol/week: 0.0 oz    Comment: rarely  . Drug use: No  . Sexual activity: Yes    Partners: Female

## 2017-04-28 ENCOUNTER — Encounter (INDEPENDENT_AMBULATORY_CARE_PROVIDER_SITE_OTHER): Payer: Self-pay | Admitting: Orthopedic Surgery

## 2017-04-28 ENCOUNTER — Ambulatory Visit (INDEPENDENT_AMBULATORY_CARE_PROVIDER_SITE_OTHER): Payer: Managed Care, Other (non HMO)

## 2017-04-28 ENCOUNTER — Ambulatory Visit (INDEPENDENT_AMBULATORY_CARE_PROVIDER_SITE_OTHER): Payer: Managed Care, Other (non HMO) | Admitting: Orthopedic Surgery

## 2017-04-28 VITALS — Ht 70.0 in | Wt 234.0 lb

## 2017-04-28 DIAGNOSIS — M25572 Pain in left ankle and joints of left foot: Secondary | ICD-10-CM

## 2017-04-28 DIAGNOSIS — M19072 Primary osteoarthritis, left ankle and foot: Secondary | ICD-10-CM

## 2017-04-28 NOTE — Progress Notes (Signed)
Office Visit Note   Patient: Cameron Hood           Date of Birth: 25-Dec-1975           MRN: [id number] Visit Date: 04/28/2017              Requested by: Steele Sizer, [address] [place], [place] [address] PCP: Steele Sizer, MD  Chief Complaint  Patient presents with  . Left Ankle - Routine Post Op    02/26/17 Left subtalar & talonavicular fusion.      HPI: Patient is 4 weeks status post left subtalar and talonavicular fusion.  Patient has been full weight bearing in regular shoewear for last 4 weeks. Has gotten some new supportive sneakers that he is quite pleased with. Is ready to return to work. Using Aleve for pain managment.   Assessment & Plan: Visit Diagnoses:  1. Pain in left ankle and joints of left foot   2. Arthritis of left subtalar joint     Plan: Recommended knee-high compression stockings and elevation to decrease the swelling.  Patient will continue with regular shoe wear, activities as tolerated. Have provided a note to return to work on 04/29/17.   Follow-Up Instructions: Return in about 4 weeks (around 05/26/2017), or if symptoms worsen or fail to improve.   Ortho Exam  Patient is alert, oriented, no adenopathy, well-dressed, normal affect, normal respiratory effort. Examination patient does have some mild swelling. There is no redness no cellulitis no signs of infection.  He does have a little numbness around the surgical incisions.   Imaging: Xr Ankle Complete Left  Result Date: 04/28/2017 Radiographs of the left ankle show stable alignment of the subtalar and talonavicular fusion. There are no hardware failure; no complicating features.   No images are attached to the encounter.  Labs: Lab Results  Component Value Date   HGBA1C 5.3 12/07/2016   HGBA1C 5.1 12/04/2015    @LABSALLVALUES (HGBA1)@  Body mass index is 33.58 kg/m.  Orders:  Orders Placed This Encounter  Procedures  . XR Ankle Complete Left   No orders  of the defined types were placed in this encounter.    Procedures: No procedures performed  Clinical Data: No additional findings.  ROS:  All other systems negative, except as noted in the HPI. Review of Systems  Constitutional: Negative for chills and fever.  Musculoskeletal: Positive for arthralgias and joint swelling.    Objective: Vital Signs: Ht 5' 10"  (1.778 m)   Wt 234 lb (106.1 kg)   BMI 33.58 kg/m   Specialty Comments:  No specialty comments available.  PMFS History: Patient Active Problem List   Diagnosis Date Noted  . Arthritis of left subtalar joint   . Dermatitis 12/07/2016  . Strabismus 12/04/2015  . History of Lyme disease 10/31/2014  . OSA (obstructive sleep apnea) 09/19/2014  . Obesity (BMI 30.0-34.9) 09/19/2014  . Thyroid activity decreased 09/19/2014  . Hypogonadism in male 09/19/2014   Past Medical History:  Diagnosis Date  . Disseminated Lyme disease   . GERD (gastroesophageal reflux disease)    occ tx w/ tums  . Low testosterone   . Lumbago     Family History  Problem Relation Age of Onset  . Hypertension Father   . Diabetes Father   . Anxiety disorder Mother   . Cancer Paternal Grandmother        unsure maybe pancreatic     Past Surgical History:  Procedure Laterality Date  .  ANKLE FUSION Left 02/26/2017   Procedure: LEFT SUBTALAR AND TALONAVICULAR FUSION;  Surgeon: Newt Minion, MD;  Location: [facility];  Service: Orthopedics;  Laterality: Left;  . ANKLE SURGERY Right   . FUSION OF TALONAVICULAR JOINT Left 02/26/2017   Social History   Occupational History  . Occupation: Dealer  Tobacco Use  . Smoking status: Never Smoker  . Smokeless tobacco: Never Used  Substance and Sexual Activity  . Alcohol use: Yes    Alcohol/week: 0.0 oz    Comment: rarely  . Drug use: No  . Sexual activity: Yes    Partners: Female

## 2017-04-29 ENCOUNTER — Ambulatory Visit (INDEPENDENT_AMBULATORY_CARE_PROVIDER_SITE_OTHER): Payer: Managed Care, Other (non HMO) | Admitting: Orthopedic Surgery

## 2017-12-09 ENCOUNTER — Encounter: Payer: 59 | Admitting: Family Medicine

## 2018-07-25 ENCOUNTER — Telehealth: Payer: Self-pay | Admitting: Family

## 2018-07-25 ENCOUNTER — Other Ambulatory Visit: Payer: Self-pay

## 2018-07-25 DIAGNOSIS — R6883 Chills (without fever): Secondary | ICD-10-CM

## 2018-07-25 DIAGNOSIS — R0602 Shortness of breath: Secondary | ICD-10-CM

## 2018-07-25 DIAGNOSIS — R059 Cough, unspecified: Secondary | ICD-10-CM

## 2018-07-25 DIAGNOSIS — R05 Cough: Secondary | ICD-10-CM

## 2018-07-25 NOTE — Telephone Encounter (Signed)
Call received from Damita Lack NP at [facility]. Received phone order for Covid-19 test. Pt with fever, SOB, cough, Called pt, appt scheduled and order placed.

## 2018-07-28 LAB — NOVEL CORONAVIRUS, NAA: SARS-CoV-2, NAA: NOT DETECTED

## 2018-12-26 ENCOUNTER — Ambulatory Visit: Payer: Self-pay | Admitting: Orthopaedic Surgery

## 2018-12-27 ENCOUNTER — Ambulatory Visit (INDEPENDENT_AMBULATORY_CARE_PROVIDER_SITE_OTHER): Payer: Managed Care, Other (non HMO)

## 2018-12-27 ENCOUNTER — Ambulatory Visit (INDEPENDENT_AMBULATORY_CARE_PROVIDER_SITE_OTHER): Payer: Managed Care, Other (non HMO) | Admitting: Orthopaedic Surgery

## 2018-12-27 ENCOUNTER — Encounter: Payer: Self-pay | Admitting: Orthopaedic Surgery

## 2018-12-27 ENCOUNTER — Other Ambulatory Visit: Payer: Self-pay

## 2018-12-27 DIAGNOSIS — M25532 Pain in left wrist: Secondary | ICD-10-CM

## 2018-12-27 NOTE — Progress Notes (Signed)
Office Visit Note   Patient: Cameron Hood           Date of Birth: 01/17/1976           MRN: [id number] Visit Date: 12/27/2018              Requested by: Steele Sizer, [address] [place],  [place] [address] PCP: Steele Sizer, MD   Assessment & Plan: Visit Diagnoses:  1. Pain in left wrist     Plan: I have recommended a steroid injection on the ulnar aspect of his wrist and we found the area where he is most point tender and provided an injection of 1 cc of lidocaine and 1 cc of Depo-Medrol which he tolerated well.  I do feel it is worth seeing him back in 3 weeks to see how he is done overall.  He can continue to use his wrist.  If he continues to exhibit ulnar-sided wrist pain at his next visit, a MRI would be warranted to rule out an injury to the extensor carpi ulnaris tendon or the TFCC on that side of his wrist.  Follow-Up Instructions: Return in about 3 weeks (around 01/17/2019).   Orders:  Orders Placed This Encounter  Procedures  . Medium Joint Inj  . XR Wrist Complete Left   No orders of the defined types were placed in this encounter.     Procedures: Medium Joint Inj: L intercarpal on 12/27/2018 10:59 AM      Clinical Data: No additional findings.   Subjective: Chief Complaint  Patient presents with  . 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  He denies any injury but he does put a lot of torquing through his left wrist.  He has noticed decreased strength he thought it was a pulled muscle.  After continued pain with range of motion and his activities at work he has come in for further relation treatment of left wrist pain.  He denies any numbness and tingling in his fingers.  Denies any neck pain.  Denies any pain in other aspects of his left hand or wrist  HPI  Review of Systems He currently denies any headache, chest pain,  shortness of breath, fever, chills, nausea, vomiting  Objective: Vital Signs: There were no vitals taken for this visit.  Physical Exam He is alert and orient x3 and in no acute distress Ortho Exam Examination of his left wrist does show ulnar-sided wrist pain to palpation along the ulnar styloid and at the wrist joint on the ulnar side of the wrist joint.  Most of his pain is just inferior/volar to the ulnar styloid and the distal ulna.  There is no incongruity or instability of the DRUJ.  There is no snapping or clicking of the extensor carpi ulnaris tendon. Specialty Comments:  No specialty comments available.  Imaging: Xr Wrist Complete Left  Result Date: 12/27/2018 3 views of the left wrist show no acute findings.  The carpal bones appear normal and the alignment of the distal radial ulnar joint is normal.  The ulnar styloid appears normal as does the carpal bones on the ulnar aspect of the wrist where the patient exhibits pain.    PMFS History: Patient Active Problem List   Diagnosis Date Noted  . Arthritis of left subtalar joint   . Dermatitis 12/07/2016  . Strabismus 12/04/2015  . History  of Lyme disease 10/31/2014  . OSA (obstructive sleep apnea) 09/19/2014  . Obesity (BMI 30.0-34.9) 09/19/2014  . Thyroid activity decreased 09/19/2014  . Hypogonadism in male 09/19/2014   Past Medical History:  Diagnosis Date  . Disseminated Lyme disease   . GERD (gastroesophageal reflux disease)    occ tx w/ tums  . Low testosterone   . Lumbago     Family History  Problem Relation Age of Onset  . Hypertension Father   . Diabetes Father   . Anxiety disorder Mother   . Cancer Paternal Grandmother        unsure maybe pancreatic     Past Surgical History:  Procedure Laterality Date  . ANKLE FUSION Left 02/26/2017   Procedure: LEFT SUBTALAR AND TALONAVICULAR FUSION;  Surgeon: Newt Minion, MD;  Location: [facility];  Service: Orthopedics;  Laterality: Left;  . ANKLE SURGERY Right    . FUSION OF TALONAVICULAR JOINT Left 02/26/2017   Social History   Occupational History  . Occupation: Dealer  Tobacco Use  . Smoking status: Never Smoker  . Smokeless tobacco: Never Used  Substance and Sexual Activity  . Alcohol use: Yes    Alcohol/week: 0.0 standard drinks    Comment: rarely  . Drug use: No  . Sexual activity: Yes    Partners: Female

## 2019-01-17 ENCOUNTER — Ambulatory Visit: Payer: Managed Care, Other (non HMO) | Admitting: Orthopaedic Surgery

## 2019-01-30 ENCOUNTER — Encounter: Payer: Self-pay | Admitting: Orthopaedic Surgery

## 2019-01-30 ENCOUNTER — Other Ambulatory Visit: Payer: Self-pay

## 2019-01-30 ENCOUNTER — Ambulatory Visit: Payer: Managed Care, Other (non HMO) | Admitting: Orthopaedic Surgery

## 2019-01-30 DIAGNOSIS — M25532 Pain in left wrist: Secondary | ICD-10-CM

## 2019-01-30 NOTE — Progress Notes (Signed)
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  I seen him for this before.  He has worked on activity modification.  He has tried topical and oral anti-inflammatories.  I have even injected his left wrist at the ulnar aspect with a steroid.  He still having persistent pain in that wrist and is not getting better.  On exam he has significant ulnar-sided wrist pain to palpation and with ulnar and radial deviation of the wrist.  He has a lot of pain along her side with dorsiflexion of the wrist as well.  Given his continued ulnar-sided wrist pain a MRI arthrogram of the left wrist is warranted to rule out a TFCC injury or even a tear of the extensor carpi ulnaris tendon.  He agrees with having this done at this point given the very conservative treatment.  All question concerns were answered addressed.  He will call for follow-up once he knows when he has the MRI being performed.

## 2019-02-01 ENCOUNTER — Other Ambulatory Visit: Payer: Self-pay | Admitting: Orthopaedic Surgery

## 2019-02-01 DIAGNOSIS — M25532 Pain in left wrist: Secondary | ICD-10-CM

## 2019-03-01 ENCOUNTER — Ambulatory Visit
Admission: RE | Admit: 2019-03-01 | Discharge: 2019-03-01 | Disposition: A | Discharge status: Home / Self Care | Payer: Managed Care, Other (non HMO) | Source: Ambulatory Visit | Attending: Orthopaedic Surgery | Admitting: Orthopaedic Surgery

## 2019-03-01 DIAGNOSIS — M25532 Pain in left wrist: Secondary | ICD-10-CM

## 2019-03-01 MED ORDER — IOPAMIDOL (ISOVUE-M 200) INJECTION 41%
2.0000 mL | Freq: Once | INTRAMUSCULAR | Status: AC
  Administered 2019-03-01: 15:00:00 2 mL via INTRA_ARTICULAR

## 2019-03-06 ENCOUNTER — Other Ambulatory Visit: Payer: Self-pay

## 2019-03-06 ENCOUNTER — Ambulatory Visit (INDEPENDENT_AMBULATORY_CARE_PROVIDER_SITE_OTHER): Payer: Managed Care, Other (non HMO) | Admitting: Orthopaedic Surgery

## 2019-03-06 ENCOUNTER — Encounter: Payer: Self-pay | Admitting: Orthopaedic Surgery

## 2019-03-06 DIAGNOSIS — S63592D Other specified sprain of left wrist, subsequent encounter: Secondary | ICD-10-CM

## 2019-03-06 NOTE — Progress Notes (Signed)
The patient comes in today to go over an MRI of his left wrist.  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  We tried a steroid injection and this only subsided his symptoms slightly.  We sent him for an MRI arthrogram and is here for review this today.  This is of the left wrist.  On exam he still has consistent pain in terms of the area where he is hurting on the ulnar aspect of his left wrist.  The extremes of pronation and supination causes a significant amount of pain.  There is no snapping or clicking.  The MRI is reviewed with him and it does show a central tear of the left wrist TFCC.  I went over the MRI findings.  I would like to refer him to [facility] and Dr. Burney Gauze to assess the patient's risk for possible surgical intervention.  I explained this to the patient in detail and he is agreeable to this type of referral.

## 2019-08-03 ENCOUNTER — Encounter: Payer: Self-pay | Admitting: Family Medicine

## 2020-01-12 IMAGING — MR MR WRIST*L* W/CM
6 series · 40 of 40 positions shown · IV contrast (agent unspecified)
Comparison: 12/27/2018

CLINICAL DATA: Ulnar-sided wrist pain for 3 months.

EXAM:
MRI OF THE LEFT WRIST WITH CONTRAST (MR Arthrogram)
TECHNIQUE: Multiplanar, multisequence MR imaging of the wrist was performed
immediately following contrast injection into the radiocarpal joint
under fluoroscopic guidance. No intravenous contrast was
administered.

[Series 100: T1 fat-sat · oblique · left · 3.0mm · 0.31mm/px · 8 of 18 slices shown (1 of 3)]
[im 1/18]
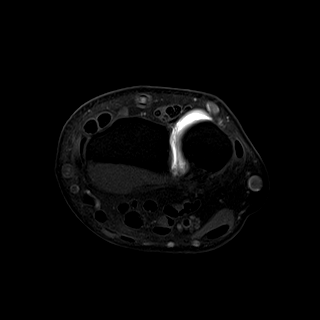
[im 3/18]
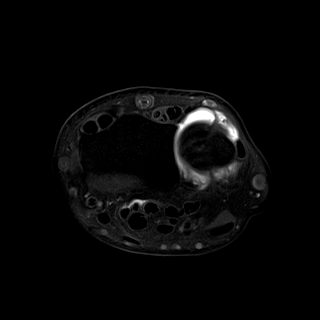
[im 5/18]
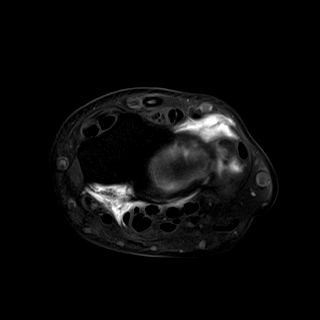
[im 8/18]
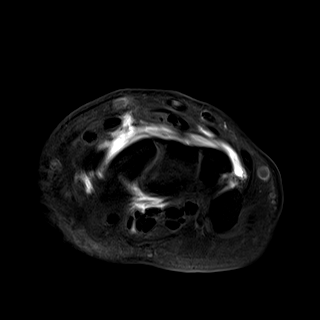
[im 10/18]
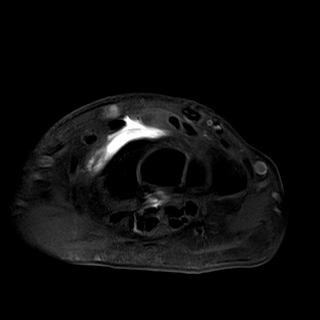
[im 13/18]
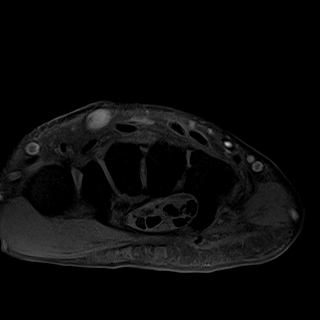
[im 15/18]
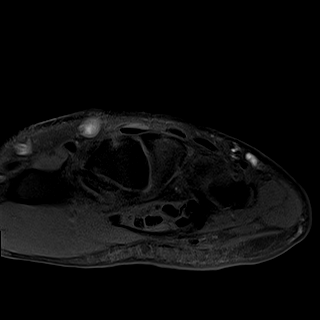
[im 18/18]
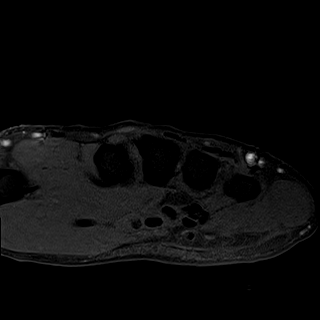

[Series 101: T2 fat-sat · oblique · left · 3.0mm · 0.28mm/px · 8 of 18 slices shown (1 of 2)]
[im 1/18]
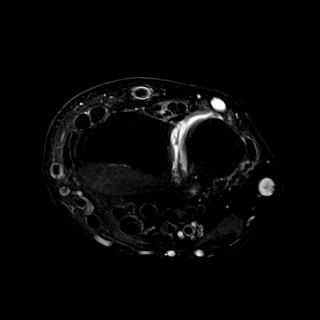
[im 3/18]
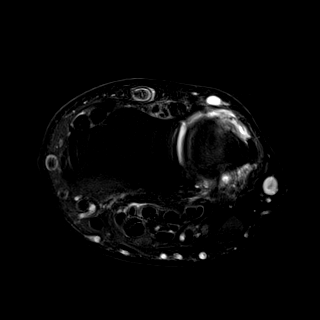
[im 5/18]
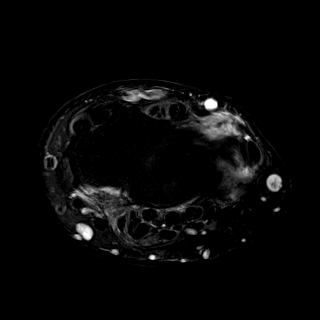
[im 8/18]
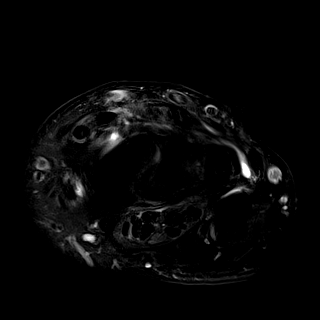
[im 10/18]
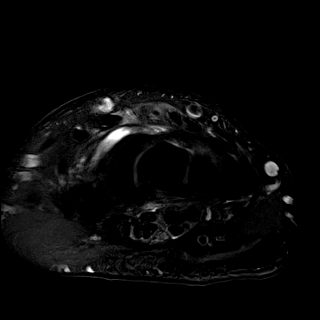
[im 13/18]
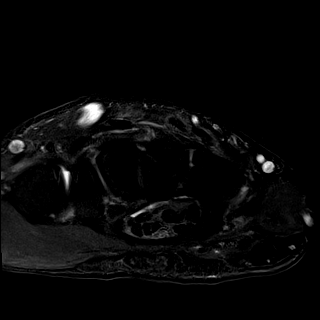
[im 15/18]
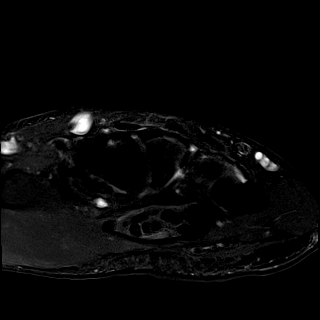
[im 18/18]
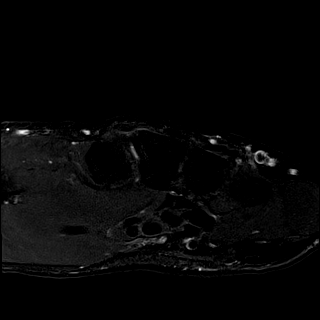

[Series 102: T1 fat-sat · coronal · left · 3.0mm · 0.31mm/px · 5 of 11 slices shown (2 of 3)]
[im 1/11]
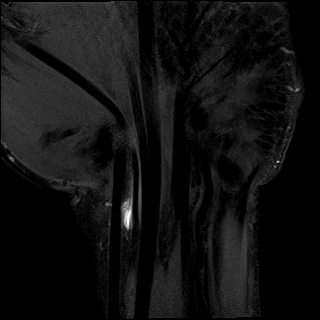
[im 3/11]
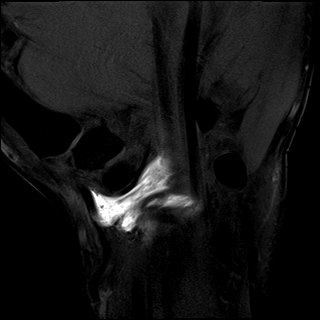
[im 6/11]
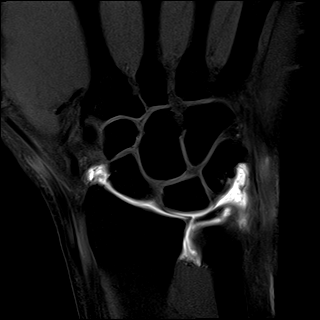
[im 8/11]
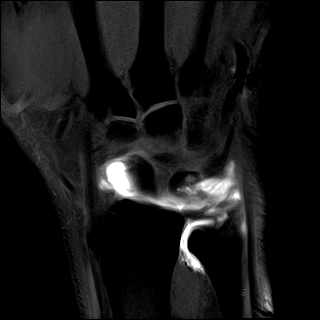
[im 11/11]
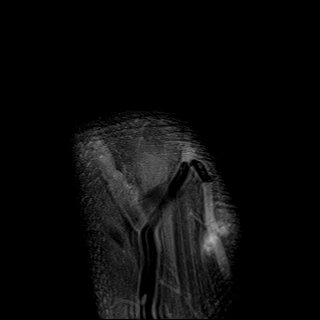

[Series 103: T1 · coronal · left · 3.0mm · 0.31mm/px · 5 of 11 slices shown]
[im 1/11]
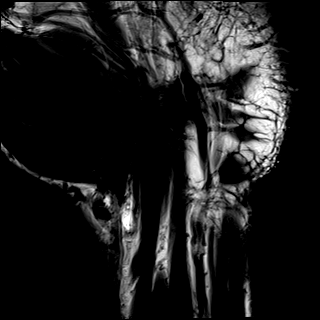
[im 3/11]
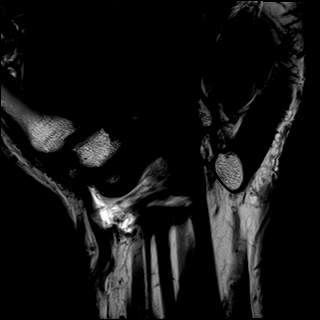
[im 6/11]
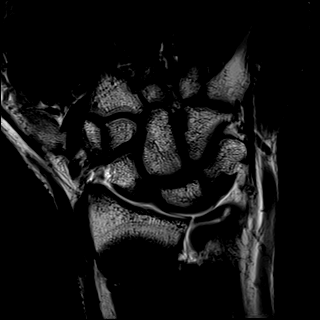
[im 8/11]
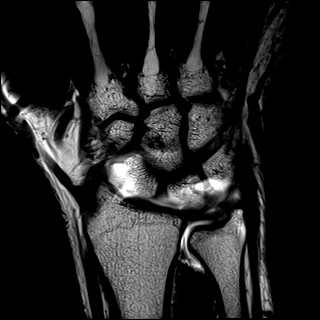
[im 11/11]
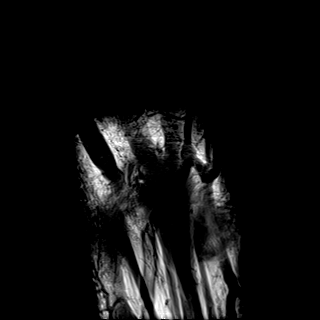

[Series 104: T2 fat-sat · coronal · left · 3.0mm · 0.31mm/px · 5 of 11 slices shown (2 of 2)]
[im 1/11]
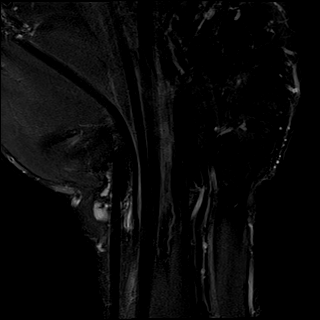
[im 3/11]
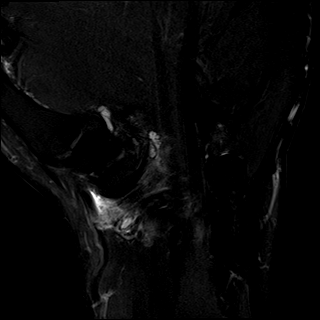
[im 6/11]
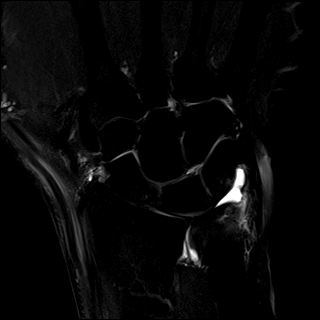
[im 8/11]
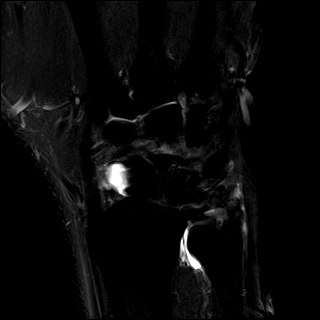
[im 11/11]
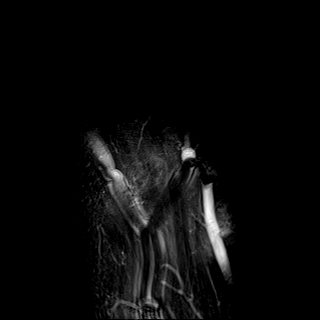

[Series 105: T1 fat-sat · oblique · left · 3.0mm · 0.31mm/px · 9 of 19 slices shown (3 of 3)]
[im 1/19]
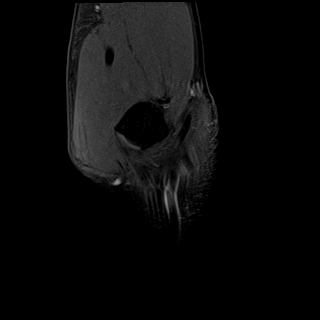
[im 3/19]
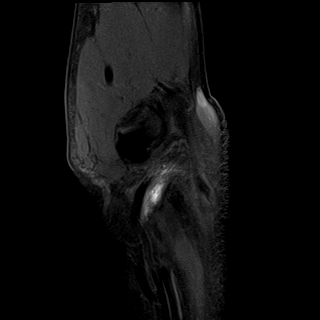
[im 5/19]
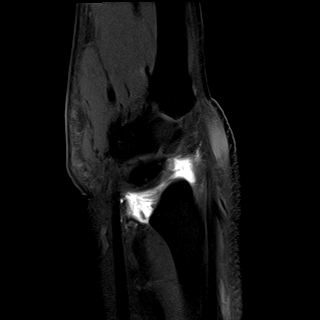
[im 7/19]
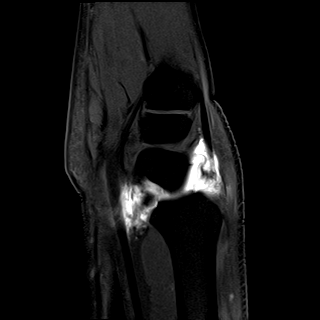
[im 10/19]
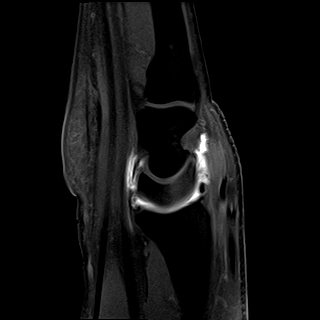
[im 12/19]
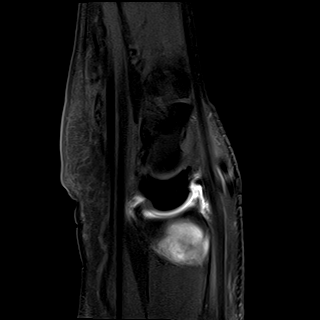
[im 14/19]
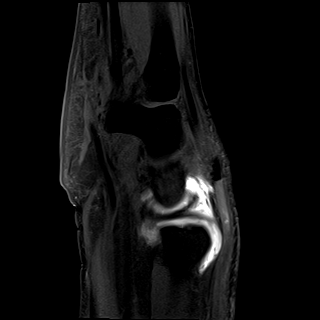
[im 16/19]
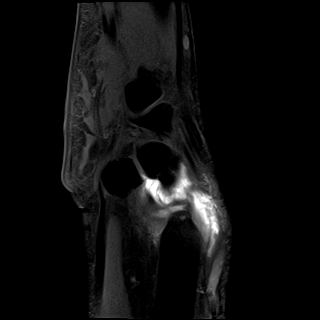
[im 19/19]
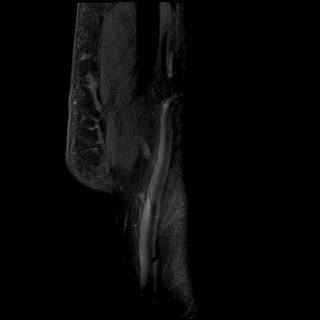

[40 of 40 positions shown; findings below may reference images not displayed]

FINDINGS: Ligaments: The intercarpal ligaments are intact. No extravasation of
contrast into the midcarpal joint space.

Triangular fibrocartilage: There is a central tear of the TFCC with
extravasation of contrast into the radioulnar joint.

Tendons: The dorsal and volar wrist tendons are intact. No tendon
rupture, tenosynovitis or significant tendinopathy.

Carpal tunnel/median nerve: The carpal tunnel structures appear
normal. No tendinopathy or tear. No mass or ganglion cyst. The
median nerve appears normal.

Guyon's canal: No mass or mass effect.

Joint/cartilage: The articular cartilage appears normal. No
cartilage defect or osteochondral lesion.

Bones/carpal alignment: Normal carpal alignment. No acute bony
findings.

Other: Normal appearance of the hand and wrist musculature.
IMPRESSION: 1. Central tear of the TFCC.
2. No acute bony findings or degenerative changes.
3. Intact intercarpal ligaments.

## 2021-08-25 ENCOUNTER — Other Ambulatory Visit: Payer: Self-pay

## 2021-08-25 ENCOUNTER — Encounter: Payer: Self-pay | Admitting: *Deleted

## 2021-08-25 ENCOUNTER — Emergency Department: Payer: Managed Care, Other (non HMO)

## 2021-08-25 ENCOUNTER — Emergency Department
Admission: EM | Admit: 2021-08-25 | Discharge: 2021-08-25 | Disposition: A | Discharge status: Home / Self Care | Payer: Managed Care, Other (non HMO) | Attending: Emergency Medicine | Admitting: Emergency Medicine

## 2021-08-25 DIAGNOSIS — M546 Pain in thoracic spine: Secondary | ICD-10-CM | POA: Diagnosis not present

## 2021-08-25 DIAGNOSIS — R079 Chest pain, unspecified: Secondary | ICD-10-CM

## 2021-08-25 DIAGNOSIS — R0789 Other chest pain: Secondary | ICD-10-CM | POA: Diagnosis present

## 2021-08-25 DIAGNOSIS — I10 Essential (primary) hypertension: Secondary | ICD-10-CM

## 2021-08-25 LAB — CBC
HCT: 49.4 % (ref 39.0–52.0)
Hemoglobin: 16.3 g/dL (ref 13.0–17.0)
MCH: 27.8 pg (ref 26.0–34.0)
MCHC: 33 g/dL (ref 30.0–36.0)
MCV: 84.2 fL (ref 80.0–100.0)
Platelets: 299 10*3/uL (ref 150–400)
RBC: 5.87 MIL/uL — ABNORMAL HIGH (ref 4.22–5.81)
RDW: 13 % (ref 11.5–15.5)
WBC: 6.9 10*3/uL (ref 4.0–10.5)
nRBC: 0 % (ref 0.0–0.2)

## 2021-08-25 LAB — TROPONIN I (HIGH SENSITIVITY): Troponin I (High Sensitivity): 3 ng/L (ref <18)

## 2021-08-25 LAB — BASIC METABOLIC PANEL
Anion gap: 7 (ref 5–15)
BUN: 19 mg/dL (ref 6–20)
CO2: 27 mmol/L (ref 22–32)
Calcium: 9.5 mg/dL (ref 8.9–10.3)
Chloride: 106 mmol/L (ref 98–111)
Creatinine, Ser: 0.96 mg/dL (ref 0.61–1.24)
GFR, Estimated: >60 mL/min (ref >60)
Glucose, Bld: 101 mg/dL — ABNORMAL HIGH (ref 70–99)
Potassium: 3.8 mmol/L (ref 3.5–5.1)
Sodium: 140 mmol/L (ref 135–145)

## 2021-08-25 NOTE — ED Provider Notes (Signed)
[facility] Provider Note    Event Date/Time   First MD Initiated Contact with Patient 08/25/21 Cameron Hood     (approximate)   History   Chest Pain   HPI  Cameron Hood is a 46 y.o. male with a past medical history of GERD, remote disseminated Lyme disease and low testosterone on topical testosterone repletion who presents after being referred from urgent care for evaluation of some chest pain and back pain.  Patient states he noticed it 3 to 4 days ago.  He states it feels like tightness or pinching in his left upper back feels like he had some tightness in his chest as well.  Is unclear on history of these are radiating from one side to the other through the chest or over the shoulder or 2 separate issues.  He states his back pain is better on palpation and his chest pain is better when he takes a deep breath.  He states it seems to come and go and last for few minutes and he only has a 1/10 intensity pain at the moment.  He has no fevers, cough, nausea, vomiting, diarrhea, other back pain, headache, earache, sore throat, rash or any recent falls or injuries.  No tobacco abuse EtOH use or illicit drug use.  No history of CAD, DM, HDL or DM although patient states sometimes will have whitecoat hypertension but is not formally diagnosed or treated for this.    Past Medical History:  Diagnosis Date   Disseminated Lyme disease    GERD (gastroesophageal reflux disease)    occ tx w/ tums   Low testosterone    Lumbago      Physical Exam  Triage Vital Signs: ED Triage Vitals  Enc Vitals Group     BP 08/25/21 1627 (!) 144/96     Pulse Rate 08/25/21 1627 (!) 57     Resp 08/25/21 1627 18     Temp 08/25/21 1627 98.9 F (37.2 C)     Temp Source 08/25/21 1627 Oral     SpO2 08/25/21 1627 98 %     Weight 08/25/21 1628 245 lb (111.1 kg)     Height 08/25/21 1628 5' 10"  (1.778 m)     Head Circumference --      Peak Flow --      Pain Score 08/25/21 1628 2     Pain Loc  --      Pain Edu? --      Excl. in [place]? --     Most recent vital signs: Vitals:   08/25/21 1627  BP: (!) 144/96  Pulse: (!) 57  Resp: 18  Temp: 98.9 F (37.2 C)  SpO2: 98%    General: Awake, no distress.  CV:  Good peripheral perfusion.  2+ radial pulses.  Slightly bradycardic. Resp:  Normal effort.  Clear bilaterally Abd:  No distention.  Soft throughout Other:  Patient's back no evidence of a rash.  Patient states his pain is better on palpation of focal area of tight muscles over the medial aspect of the left scapula.   ED Results / Procedures / Treatments  Labs (all labs ordered are listed, but only abnormal results are displayed) Labs Reviewed  BASIC METABOLIC PANEL - Abnormal; Notable for the following components:      Result Value   Glucose, Bld 101 (*)    All other components within normal limits  CBC - Abnormal; Notable for the following components:   RBC 5.87 (*)  All other components within normal limits  TROPONIN I (HIGH SENSITIVITY)  TROPONIN I (HIGH SENSITIVITY)     EKG  ECG is remarkable for sinus bradycardia with ventricular rate of 58, normal axis, without clear evidence of acute ischemia otherwise nonspecific change in lead III.   RADIOLOGY Chest reviewed by myself shows no focal consoidation, effusion, edema, pneumothorax or other clear acute thoracic process. I also reviewed radiology interpretation and agree with findings described.    PROCEDURES:  Critical Care performed: No  Procedures    MEDICATIONS ORDERED IN ED: Medications - No data to display   IMPRESSION / MDM / [place] / ED COURSE  I reviewed the triage vital signs and the nursing notes. Patient's presentation is most consistent with acute presentation with potential threat to life or bodily function.                               Differential diagnosis includes, but is not limited to muscle spasm, ACS, spontaneous apical pneumothorax versus possible pinched  nerve around the left scapula.  Patient is PERC negative and low suspicion for PE.  He states his pain comes and goes and given is better with deep breathing and on palpation of his back I have a very low suspicion at this time for dissection.  ECG is remarkable for sinus bradycardia with ventricular rate of 58, normal axis, without clear evidence of acute ischemia otherwise nonspecific change in lead III.  Given nonelevated troponin obtained greater than 3 hours after symptom onset have a very low suspicion for an acute ACS event.  Overall description of symptoms is not suggestive of unstable angina.  Chest reviewed by myself shows no focal consoidation, effusion, edema, pneumothorax or other clear acute thoracic process. I also reviewed radiology interpretation and agree with findings described.  BMP shows no significant lecture metabolic derangements.  CBC shows no leukocytosis or acute anemia.  Suspect likely MSK etiology although advised patient that would likely require further evaluation with nonemergent stress testing and possibly additional testing as started by his PCP.  I discussed that if he experiences sudden change of symptoms or any acute new symptoms that I recommend he return immediately to emergency room for evaluation.  Otherwise at this point think is stable for discharge continue outpatient evaluation.  He is agreeable this plan.  He will contact his PCP tomorrow.  He is declining any analgesia emergency room.  Discharged in stable condition.  Strict return precautions advised and discussed.      FINAL CLINICAL IMPRESSION(S) / ED DIAGNOSES   Final diagnoses:  Chest pain, unspecified type  Acute left-sided thoracic back pain  Hypertension, unspecified type     Rx / DC Orders   ED Discharge Orders     None        Note:  This document was prepared using Dragon voice recognition software and may include unintentional dictation errors.   [name],  MD 08/25/21 1843

## 2021-08-25 NOTE — ED Provider Triage Note (Signed)
Emergency Medicine Provider Triage Evaluation  Cameron Hood , a 46 y.o. male  was evaluated in triage.  Pt complains of complaints of chest pain and radiation to left shoulder started this morning.  States MD saw a change in his EKG and sent to the ER.  Review of Systems  Positive: Chest pain Negative: Injury  Physical Exam  BP (!) 144/96 (BP Location: Left Arm)   Pulse (!) 57   Temp 98.9 F (37.2 C) (Oral)   Resp 18   Ht 5' 10"  (1.778 m)   Wt 111.1 kg   SpO2 98%   BMI 35.15 kg/m  Gen:   Awake, no distress    Resp:  Normal effort  MSK:   Moves extremities without difficulty  Other:    Medical Decision Making  Medically screening exam initiated at 4:33 PM.  Appropriate orders placed.  Cameron Hood was informed that the remainder of the evaluation will be completed by another provider, this initial triage assessment does not replace that evaluation, and the importance of remaining in the ED until their evaluation is complete.  Chest pain protocols initiated patient appears to be stable   Versie Starks, PA-C 08/25/21 1634

## 2021-08-25 NOTE — ED Triage Notes (Signed)
Pt sent from urgent care for eval of neck pain/chest pain for 3 days.  No sob.  No n/v/ pt reports intermittent tightness in chest.  Pt alert

## 2022-02-09 ENCOUNTER — Ambulatory Visit: Payer: Managed Care, Other (non HMO) | Admitting: Dermatology

## 2022-02-09 DIAGNOSIS — Z79899 Other long term (current) drug therapy: Secondary | ICD-10-CM | POA: Diagnosis not present

## 2022-02-09 DIAGNOSIS — L409 Psoriasis, unspecified: Secondary | ICD-10-CM | POA: Diagnosis not present

## 2022-02-09 MED ORDER — ZORYVE 0.3 % EX CREA: 1.0000 "application " | TOPICAL_CREAM | Freq: Every day | CUTANEOUS | 3 refills | Status: DC

## 2022-02-09 NOTE — Patient Instructions (Signed)
Psoriasis is a chronic non-curable, but treatable genetic/hereditary disease that may have other systemic features affecting other organ systems such as joints (Psoriatic Arthritis). It is associated with an increased risk of inflammatory bowel disease, heart disease, non-alcoholic fatty liver disease, and depression.    Start Zoryve cream daily. Continue Triamcinolone 0.1% cream daily Saturday and Sunday only    Your prescription was sent to [facility] in [place]. A representative from [facility] will contact you within 2 business hours to verify your address and insurance information to schedule a free delivery. If for any reason you do not receive a phone call from them, please reach out to them. Their phone number is [phone] and their hours are Monday-Friday 9:00 am-5:00 pm.      Due to recent changes in healthcare laws, you may see results of your pathology and/or laboratory studies on MyChart before the doctors have had a chance to review them. We understand that in some cases there may be results that are confusing or concerning to you. Please understand that not all results are received at the same time and often the doctors may need to interpret multiple results in order to provide you with the best plan of care or course of treatment. Therefore, we ask that you please give [place] 2 business days to thoroughly review all your results before contacting the office for clarification. Should we see a critical lab result, you will be contacted sooner.   If You Need Anything After Your Visit  If you have any questions or concerns for your doctor, please call our main line at [phone] and press option 4 to reach your doctor's medical assistant. If no one answers, please leave a voicemail as directed and we will return your call as soon as possible. Messages left after 4 pm will be answered the following business day.   You may also send [place] a message via MyChart. We typically  respond to MyChart messages within 1-2 business days.  For prescription refills, please ask your pharmacy to contact our office. Our fax number is [phone].  If you have an urgent issue when the clinic is closed that cannot wait until the next business day, you can page your doctor at the number below.    Please note that while we do our best to be available for urgent issues outside of office hours, we are not available 24/7.   If you have an urgent issue and are unable to reach [place], you may choose to seek medical care at your doctor's office, retail clinic, urgent care center, or emergency room.  If you have a medical emergency, please immediately call 911 or go to the emergency department.  Pager Numbers  - Dr. Gwen Pounds: [phone]  - Dr. Neale Burly: [phone]  - Dr. Roseanne Reno: [phone]  In the event of inclement weather, please call our main line at [phone] for an update on the status of any delays or closures.  Dermatology Medication Tips: Please keep the boxes that topical medications come in in order to help keep track of the instructions about where and how to use these. Pharmacies typically print the medication instructions only on the boxes and not directly on the medication tubes.   If your medication is too expensive, please contact our office at [phone] option 4 or send [place] a message through MyChart.   We are unable to tell what your co-pay for medications will be in advance as this is different depending on your insurance coverage. However,  we may be able to find a substitute medication at lower cost or fill out paperwork to get insurance to cover a needed medication.   If a prior authorization is required to get your medication covered by your insurance company, please allow [place] 1-2 business days to complete this process.  Drug prices often vary depending on where the prescription is filled and some pharmacies may offer cheaper prices.  The website  www.goodrx.com contains coupons for medications through different pharmacies. The prices here do not account for what the cost may be with help from insurance (it may be cheaper with your insurance), but the website can give you the price if you did not use any insurance.  - You can print the associated coupon and take it with your prescription to the pharmacy.  - You may also stop by our office during regular business hours and pick up a GoodRx coupon card.  - If you need your prescription sent electronically to a different pharmacy, notify our office through [facility] or by phone at [phone] option 4.     Si Usted Necesita Algo Despus de Su Visita  Tambin puede enviarnos un mensaje a travs de Clinical cytogeneticist. Por lo general respondemos a los mensajes de MyChart en el transcurso de 1 a 2 das hbiles.  Para renovar recetas, por favor pida a su farmacia que se ponga en contacto con nuestra oficina. Annie Sable de fax es [place] [phone].  Si tiene un asunto urgente cuando la clnica est cerrada y que no puede esperar hasta el siguiente da hbil, puede llamar/localizar a su doctor(a) al nmero que aparece a continuacin.   Por favor, tenga en cuenta que aunque hacemos todo lo posible para estar disponibles para asuntos urgentes fuera del horario de [place], no estamos disponibles las 24 horas del da, los 7 [address]  [address] de la [place].   Si tiene un problema urgente y no puede comunicarse con nosotros, puede optar por buscar atencin mdica  en el consultorio de su doctor(a), en una clnica privada, en un centro de atencin urgente o en una sala de emergencias.  Si tiene Engineer, drilling, por favor llame inmediatamente al 911 o vaya a la sala de emergencias.  Nmeros de bper  - Dr. Gwen Pounds: [phone]  - Dra. Moye: [phone]  - Dra. Roseanne Reno: [phone]  En caso de inclemencias del [place], por favor llame a Lacy Duverney principal al [phone] para una actualizacin  sobre el [place] de cualquier retraso o cierre.  Consejos para la medicacin en dermatologa: Por favor, guarde las cajas en las que vienen los medicamentos de uso tpico para ayudarle a seguir las instrucciones sobre dnde y cmo usarlos. Las farmacias generalmente imprimen las instrucciones del medicamento slo en las cajas y no directamente en los tubos del [place].   Si su medicamento es muy caro, por favor, pngase en contacto con Rolm Gala llamando al [phone] y presione la opcin 4 o envenos un mensaje a travs de Clinical cytogeneticist.   No podemos decirle cul ser su copago por los medicamentos por adelantado ya que esto es diferente dependiendo de la cobertura de su seguro. Sin embargo, es posible que podamos encontrar un medicamento sustituto a Audiological scientist un formulario para que el seguro cubra el medicamento que se considera necesario.   Si se requiere una autorizacin previa para que su compaa de seguros [place] su medicamento, por favor permtanos de 1 a 2 das hbiles para completar [address]  Los precios de los  medicamentos varan con frecuencia dependiendo del lugar de dnde se surte la receta y alguna farmacias pueden ofrecer precios ms baratos.  El sitio web www.goodrx.com tiene cupones para medicamentos de Airline pilot. Los precios aqu no tienen en cuenta lo que podra costar con la ayuda del seguro (puede ser ms barato con su seguro), pero el sitio web puede darle el precio si no utiliz Research scientist (physical sciences).  - Puede imprimir el cupn correspondiente y llevarlo con su receta a la farmacia.  - Tambin puede pasar por nuestra oficina durante el horario de atencin regular y Charity fundraiser una tarjeta de cupones de GoodRx.  - Si necesita que su receta se enve electrnicamente a una farmacia diferente, informe a nuestra oficina a travs de MyChart de [place] o por telfono llamando al [phone] y presione la opcin 4.

## 2022-02-09 NOTE — Progress Notes (Signed)
   New Patient Visit  Subjective  Cameron Hood is a 46 y.o. male who presents for the following: Rash (Lower legs that will not go away - PCP gave him TMC cream but it did not help much. It itches and burns sometimes). The patient has spots, moles and lesions to be evaluated, some may be new or changing and the patient has concerns that these could be cancer.  The following portions of the chart were reviewed this encounter and updated as appropriate:   Tobacco  Allergies  Meds  Problems  Med Hx  Surg Hx  Fam Hx     Review of Systems:  No other skin or systemic complaints except as noted in HPI or Assessment and Plan.  Objective  Well appearing patient in no apparent distress; mood and affect are within normal limits.  A focused examination was performed including trunk and extremities. Relevant physical exam findings are noted in the Assessment and Plan.  Well demarcated thin pink scaly plaques of bilateral pretibial areas and left elbow. History of scale of ears but clear today.          Assessment & Plan  Psoriasis Chronic and persistent condition with duration or expected duration over one year. Condition is symptomatic / bothersome to patient. Not to goal.  See photos Psoriasis is a chronic non-curable, but treatable genetic/hereditary disease that may have other systemic features affecting other organ systems such as joints (Psoriatic Arthritis). It is associated with an increased risk of inflammatory bowel disease, heart disease, non-alcoholic fatty liver disease, and depression.    Start Zoryve cream qd. Continue TMC 0.1% cream qd Saturday and Sunday only (patient has medication).  If not improving, will plan to start Vtama cream.  May consider systemic treatment in the future.  Roflumilast (ZORYVE) 0.3 % CREA Apply 1 application  topically daily.  Return in about 3 months (around 05/11/2022) for Psoriasis.  I, Joanie Coddington, CMA, am acting as scribe for Armida Sans, MD . Documentation: I have reviewed the above documentation for accuracy and completeness, and I agree with the above.  Armida Sans, MD

## 2022-02-21 ENCOUNTER — Encounter: Payer: Self-pay | Admitting: Dermatology

## 2022-05-14 ENCOUNTER — Ambulatory Visit: Payer: Managed Care, Other (non HMO) | Admitting: Dermatology

## 2022-05-14 VITALS — BP 131/74

## 2022-05-14 DIAGNOSIS — L409 Psoriasis, unspecified: Secondary | ICD-10-CM

## 2022-05-14 DIAGNOSIS — Z79899 Other long term (current) drug therapy: Secondary | ICD-10-CM | POA: Diagnosis not present

## 2022-05-14 NOTE — Patient Instructions (Signed)
Due to recent changes in healthcare laws, you may see results of your pathology and/or laboratory studies on MyChart before the doctors have had a chance to review them. We understand that in some cases there may be results that are confusing or concerning to you. Please understand that not all results are received at the same time and often the doctors may need to interpret multiple results in order to provide you with the best plan of care or course of treatment. Therefore, we ask that you please give us 2 business days to thoroughly review all your results before contacting the office for clarification. Should we see a critical lab result, you will be contacted sooner.   If You Need Anything After Your Visit  If you have any questions or concerns for your doctor, please call our main line at [phone] and press option 4 to reach your doctor's medical assistant. If no one answers, please leave a voicemail as directed and we will return your call as soon as possible. Messages left after 4 pm will be answered the following business day.   You may also send us a message via MyChart. We typically respond to MyChart messages within 1-2 business days.  For prescription refills, please ask your pharmacy to contact our office. Our fax number is [phone].  If you have an urgent issue when the clinic is closed that cannot wait until the next business day, you can page your doctor at the number below.    Please note that while we do our best to be available for urgent issues outside of office hours, we are not available 24/7.   If you have an urgent issue and are unable to reach us, you may choose to seek medical care at your doctor's office, retail clinic, urgent care center, or emergency room.  If you have a medical emergency, please immediately call 911 or go to the emergency department.  Pager Numbers  - Dr. Kowalski: [phone]  - Dr. Moye: [phone]  - Dr. Stewart:  [phone]  In the event of inclement weather, please call our main line at [phone] for an update on the status of any delays or closures.  Dermatology Medication Tips: Please keep the boxes that topical medications come in in order to help keep track of the instructions about where and how to use these. Pharmacies typically print the medication instructions only on the boxes and not directly on the medication tubes.   If your medication is too expensive, please contact our office at [phone] option 4 or send us a message through MyChart.   We are unable to tell what your co-pay for medications will be in advance as this is different depending on your insurance coverage. However, we may be able to find a substitute medication at lower cost or fill out paperwork to get insurance to cover a needed medication.   If a prior authorization is required to get your medication covered by your insurance company, please allow us 1-2 business days to complete this process.  Drug prices often vary depending on where the prescription is filled and some pharmacies may offer cheaper prices.  The website www.goodrx.com contains coupons for medications through different pharmacies. The prices here do not account for what the cost may be with help from insurance (it may be cheaper with your insurance), but the website can give you the price if you did not use any insurance.  - You can print the associated coupon and take it with   your prescription to the pharmacy.  - You may also stop by our office during regular business hours and pick up a GoodRx coupon card.  - If you need your prescription sent electronically to a different pharmacy, notify our office through [place] MyChart or by phone at [phone] option 4.     Si Usted Necesita Algo Despus de Su Visita  Tambin puede enviarnos un mensaje a travs de MyChart. Por lo general respondemos a los mensajes de MyChart en el transcurso de 1 a 2  das hbiles.  Para renovar recetas, por favor pida a su farmacia que se ponga en contacto con nuestra oficina. Nuestro nmero de fax es el [phone].  Si tiene un asunto urgente cuando la clnica est cerrada y que no puede esperar hasta el siguiente da hbil, puede llamar/localizar a su doctor(a) al nmero que aparece a continuacin.   Por favor, tenga en cuenta que aunque hacemos todo lo posible para estar disponibles para asuntos urgentes fuera del horario de oficina, no estamos disponibles las 24 horas del da, los 7 das de la semana.   Si tiene un problema urgente y no puede comunicarse con nosotros, puede optar por buscar atencin mdica  en el consultorio de su doctor(a), en una clnica privada, en un centro de atencin urgente o en una sala de emergencias.  Si tiene una emergencia mdica, por favor llame inmediatamente al 911 o vaya a la sala de emergencias.  Nmeros de bper  - Dr. Kowalski: [phone]  - Dra. Moye: [phone]  - Dra. Stewart: [phone]  En caso de inclemencias del tiempo, por favor llame a nuestra lnea principal al [phone] para una actualizacin sobre el estado de cualquier retraso o cierre.  Consejos para la medicacin en dermatologa: Por favor, guarde las cajas en las que vienen los medicamentos de uso tpico para ayudarle a seguir las instrucciones sobre dnde y cmo usarlos. Las farmacias generalmente imprimen las instrucciones del medicamento slo en las cajas y no directamente en los tubos del medicamento.   Si su medicamento es muy caro, por favor, pngase en contacto con nuestra oficina llamando al [phone] y presione la opcin 4 o envenos un mensaje a travs de MyChart.   No podemos decirle cul ser su copago por los medicamentos por adelantado ya que esto es diferente dependiendo de la cobertura de su seguro. Sin embargo, es posible que podamos encontrar un medicamento sustituto a menor costo o llenar un formulario para que el  seguro cubra el medicamento que se considera necesario.   Si se requiere una autorizacin previa para que su compaa de seguros cubra su medicamento, por favor permtanos de 1 a 2 das hbiles para completar este proceso.  Los precios de los medicamentos varan con frecuencia dependiendo del lugar de dnde se surte la receta y alguna farmacias pueden ofrecer precios ms baratos.  El sitio web www.goodrx.com tiene cupones para medicamentos de diferentes farmacias. Los precios aqu no tienen en cuenta lo que podra costar con la ayuda del seguro (puede ser ms barato con su seguro), pero el sitio web puede darle el precio si no utiliz ningn seguro.  - Puede imprimir el cupn correspondiente y llevarlo con su receta a la farmacia.  - Tambin puede pasar por nuestra oficina durante el horario de atencin regular y recoger una tarjeta de cupones de GoodRx.  - Si necesita que su receta se enve electrnicamente a una farmacia diferente, informe a nuestra oficina a travs de MyChart de [place]   o por telfono llamando al [phone] y presione la opcin 4.  

## 2022-05-14 NOTE — Progress Notes (Signed)
   Follow-Up Visit   Subjective  Cameron Hood is a 47 y.o. male who presents for the following: Psoriasis (3 month follow up of lower legs - started Zoryve cream and it is working pretty good).  The following portions of the chart were reviewed this encounter and updated as appropriate:   Tobacco  Allergies  Meds  Problems  Med Hx  Surg Hx  Fam Hx     Review of Systems:  No other skin or systemic complaints except as noted in HPI or Assessment and Plan.  Objective  Well appearing patient in no apparent distress; mood and affect are within normal limits.  A focused examination was performed including legs. Relevant physical exam findings are noted in the Assessment and Plan.  Legs Mild plaque of left lower leg. Right lower leg clear today.   Assessment & Plan  Psoriasis Legs  Counseling on psoriasis and coordination of care  psoriasis is a chronic non-curable, but treatable genetic/hereditary disease that may have other systemic features affecting other organ systems such as joints (Psoriatic Arthritis). It is associated with an increased risk of inflammatory bowel disease, heart disease, non-alcoholic fatty liver disease, and depression.  Treatments include light and laser treatments; topical medications; and systemic medications including oral and injectables.  Improved - Continue Zoryve cream qd prn flared areas. Sample of [place] given today to try in case he has resistant areas   Related Medications Roflumilast (ZORYVE) 0.3 % CREA Apply 1 application  topically daily.   Return in about 1 year (around 05/14/2023) for Psoriasis.  I, Ashok Cordia, CMA, am acting as scribe for Sarina Ser, MD . Documentation: I have reviewed the above documentation for accuracy and completeness, and I agree with the above.  Sarina Ser, MD

## 2022-05-16 ENCOUNTER — Encounter: Payer: Self-pay | Admitting: Dermatology

## 2022-11-05 ENCOUNTER — Ambulatory Visit: Payer: Managed Care, Other (non HMO) | Admitting: Dermatology

## 2022-12-02 ENCOUNTER — Ambulatory Visit: Payer: Managed Care, Other (non HMO) | Admitting: Family Medicine

## 2022-12-04 ENCOUNTER — Ambulatory Visit: Payer: Managed Care, Other (non HMO) | Admitting: Family Medicine

## 2022-12-04 ENCOUNTER — Encounter: Payer: Self-pay | Admitting: Family Medicine

## 2022-12-04 VITALS — BP 122/84 | HR 63 | Temp 97.7°F | Resp 16 | Ht 70.0 in | Wt 260.1 lb

## 2022-12-04 DIAGNOSIS — Z6837 Body mass index (BMI) 37.0-37.9, adult: Secondary | ICD-10-CM

## 2022-12-04 DIAGNOSIS — E221 Hyperprolactinemia: Secondary | ICD-10-CM

## 2022-12-04 DIAGNOSIS — R7309 Other abnormal glucose: Secondary | ICD-10-CM

## 2022-12-04 DIAGNOSIS — Z1211 Encounter for screening for malignant neoplasm of colon: Secondary | ICD-10-CM

## 2022-12-04 DIAGNOSIS — E291 Testicular hypofunction: Secondary | ICD-10-CM

## 2022-12-04 DIAGNOSIS — E538 Deficiency of other specified B group vitamins: Secondary | ICD-10-CM

## 2022-12-04 DIAGNOSIS — Z23 Encounter for immunization: Secondary | ICD-10-CM

## 2022-12-04 DIAGNOSIS — Z1159 Encounter for screening for other viral diseases: Secondary | ICD-10-CM | POA: Diagnosis not present

## 2022-12-04 DIAGNOSIS — E559 Vitamin D deficiency, unspecified: Secondary | ICD-10-CM

## 2022-12-04 DIAGNOSIS — Z114 Encounter for screening for human immunodeficiency virus [HIV]: Secondary | ICD-10-CM

## 2022-12-04 DIAGNOSIS — E785 Hyperlipidemia, unspecified: Secondary | ICD-10-CM

## 2022-12-04 DIAGNOSIS — E039 Hypothyroidism, unspecified: Secondary | ICD-10-CM

## 2022-12-04 DIAGNOSIS — E669 Obesity, unspecified: Secondary | ICD-10-CM

## 2022-12-04 MED ORDER — ANDROGEL PUMP 20.25 MG/ACT (1.62%) TD GEL: 4.0000 | Freq: Every day | TRANSDERMAL | 0 refills | Status: DC

## 2022-12-04 NOTE — Progress Notes (Signed)
SUBJECTIVE:   Chief Complaint  Patient presents with   Establish Care   HPI Presents to clinic to establish care  No acute concerns today  Testicular failure Takes Androgel 4 pumps daily.  Review of previous PCP notes patient initially failed testosterone therapy and was switched to periodic IM testosterone. When T levels normalized was switched back to topical at patients request.  Was having difficulty getting mediation through pharmacy and financially with insurance with the topical preparation.  Had stopped topical use in 2004 for unknown reason and reported resumed later.  Was prescribed Angrogel 1.62% 4 times a day as this was what was covered by insurance. He has not been followed by Urology or Endocrinology   History of elevated Prolactin Noted during review of chart. MRI of brain 03/11/2016 did not show any pituitary lesion or mass.    PERTINENT PMH / PSH: As above  OBJECTIVE:  BP 122/84   Pulse 63   Temp 97.7 F (36.5 C)   Resp 16   Ht 5\' 10"  (1.778 m)   Wt 260 lb 2 oz (118 kg)   SpO2 96%   BMI 37.32 kg/m    Physical Exam Vitals reviewed.  Constitutional:      Appearance: He is obese.  HENT:     Head: Normocephalic.     Right Ear: Tympanic membrane, ear canal and external ear normal.     Left Ear: Tympanic membrane, ear canal and external ear normal.     Nose: Nose normal.     Mouth/Throat:     Mouth: Mucous membranes are moist.  Eyes:     Conjunctiva/sclera: Conjunctivae normal.     Pupils: Pupils are equal, round, and reactive to light.  Neck:     Thyroid: No thyromegaly or thyroid tenderness.     Vascular: No carotid bruit.  Cardiovascular:     Rate and Rhythm: Normal rate and regular rhythm.     Pulses: Normal pulses.     Heart sounds: Normal heart sounds.  Pulmonary:     Effort: Pulmonary effort is normal.     Breath sounds: Normal breath sounds.  Abdominal:     General: Abdomen is flat. Bowel sounds are normal.     Palpations: Abdomen is  soft.  Musculoskeletal:        General: Normal range of motion.     Cervical back: Normal range of motion and neck supple.     Right lower leg: No edema.     Left lower leg: No edema.  Lymphadenopathy:     Cervical: No cervical adenopathy.  Neurological:     Mental Status: He is alert.  Psychiatric:        Mood and Affect: Mood normal.        Behavior: Behavior normal.        Thought Content: Thought content normal.        Judgment: Judgment normal.        12/04/2022    9:05 AM 12/07/2016    8:52 AM 12/04/2015    2:23 PM 12/11/2014    9:01 AM 09/19/2014    9:54 AM  Depression screen PHQ 2/9  Decreased Interest 0 0 0 0 0  Down, Depressed, Hopeless 0 0 0 0 0  PHQ - 2 Score 0 0 0 0 0  Altered sleeping 1      Tired, decreased energy 1      Change in appetite 0      Feeling bad or  failure about yourself  0      Trouble concentrating 0      Moving slowly or fidgety/restless 0      Suicidal thoughts 0      PHQ-9 Score 2      Difficult doing work/chores Not difficult at all          12/04/2022    9:05 AM  GAD 7 : Generalized Anxiety Score  Nervous, Anxious, on Edge 0  Control/stop worrying 0  Worry too much - different things 0  Trouble relaxing 0  Restless 0  Easily annoyed or irritable 0  Afraid - awful might happen 0  Total GAD 7 Score 0  Anxiety Difficulty Not difficult at all    ASSESSMENT/PLAN:  Testicular failure Assessment & Plan: Refill Testosterone gel 1.62% 2 pumps BID for 30 day supply.  No further refills Referral sent to Urology for continued management  Orders: -     Ambulatory referral to Urology -     CBC with Differential/Platelet -     Testosterone; Apply 2 Pump topically 2 (two) times daily.  Dispense: 225 g; Refill: 0  Colon cancer screening -     Ambulatory referral to Gastroenterology  Need for influenza vaccination -     Flu vaccine trivalent PF, 6mos and older(Flulaval,Afluria,Fluarix,Fluzone)  Need for hepatitis C screening test -      Hepatitis C antibody  Encounter for screening for HIV -     HIV Antibody (routine testing w rflx)  Abnormal glucose -     Hemoglobin A1c  Hyperlipidemia, unspecified hyperlipidemia type -     Lipid panel  Vitamin D deficiency Assessment & Plan: Low vitamin D level Start Vitamin D 1.25 mg weekly   Orders: -     VITAMIN D 25 Hydroxy (Vit-D Deficiency, Fractures)  Vitamin B 12 deficiency Assessment & Plan: Check Vitamin B 12 level  Orders: -     Vitamin B12  Class 2 obesity without serious comorbidity with body mass index (BMI) of 37.0 to 37.9 in adult, unspecified obesity type Assessment & Plan: Encouraged healthy lifestyle Check labs today  Orders: -     Comprehensive metabolic panel -     TSH  Hyperprolactinemia (HCC) Assessment & Plan: Asymptomatic MRI brain 2018 normal Check prolactin level today Consider Endocrinology evaluation if not completed  Orders: -     Prolactin  Obesity (BMI 35.0-39.9 without comorbidity)  Hypothyroidism, unspecified type Assessment & Plan: Check TSH level    PDMP reviewed  Return if symptoms worsen or fail to improve, for PCP.  Dana Allan, MD

## 2022-12-04 NOTE — Patient Instructions (Signed)
It was a pleasure meeting you today. Thank you for allowing me to take part in your health care.  Our goals for today as we discussed include: We will get some labs today.  If they are abnormal or we need to do something about them, I will call you.  If they are normal, I will send you a message on MyChart (if it is active) or a letter in the mail.  If you don't hear from [place] in 2 weeks, please call the office at the number below.   Received Flu vaccine today   This is a list of the screening recommended for you and due dates:  Health Maintenance  Topic Date Due   COVID-19 Vaccine (1) Never done   HIV Screening  Never done   Hepatitis C Screening  Never done   Colon Cancer Screening  Never done   DTaP/Tdap/Td vaccine (3 - Td or Tdap) 02/22/2029   Flu Shot  Completed   HPV Vaccine  Aged Out      If you have any questions or concerns, please do not hesitate to call the office at [phone].  I look forward to our next visit and until then take care and stay safe.  Regards,   Dana Allan, MD   [facility]

## 2022-12-08 ENCOUNTER — Other Ambulatory Visit: Payer: Self-pay

## 2022-12-08 ENCOUNTER — Telehealth: Payer: Self-pay

## 2022-12-08 DIAGNOSIS — Z1211 Encounter for screening for malignant neoplasm of colon: Secondary | ICD-10-CM

## 2022-12-08 MED ORDER — NA SULFATE-K SULFATE-MG SULF 17.5-3.13-1.6 GM/177ML PO SOLN: 1.0000 | Freq: Once | ORAL | 0 refills | Status: AC

## 2022-12-08 NOTE — Telephone Encounter (Signed)
Gastroenterology Pre-Procedure Review  Request Date: 12/31/22 Requesting Physician: Dr. Allegra Lai  PATIENT REVIEW QUESTIONS: The patient responded to the following health history questions as indicated:    1. Are you having any GI issues? no 2. Do you have a personal history of Polyps? no 3. Do you have a family history of Colon Cancer or Polyps? no 4. Diabetes Mellitus? no 5. Joint replacements in the past 12 months?no 6. Major health problems in the past 3 months?no 7. Any artificial heart valves, MVP, or defibrillator?no    MEDICATIONS & ALLERGIES:    Patient reports the following regarding taking any anticoagulation/antiplatelet therapy:   Plavix, Coumadin, Eliquis, Xarelto, Lovenox, Pradaxa, Brilinta, or Effient? no Aspirin? no  Patient confirms/reports the following medications:  Current Outpatient Medications  Medication Sig Dispense Refill   Na Sulfate-K Sulfate-Mg Sulf 17.5-3.13-1.6 GM/177ML SOLN Take 1 kit by mouth once for 1 dose. 354 mL 0   acetaminophen (TYLENOL) 500 MG tablet Take 1,000 mg by mouth every 6 (six) hours as needed for moderate pain or headache.     ANDROGEL PUMP 20.25 MG/ACT (1.62%) GEL Apply 4 Pump topically daily. 75 g 0   loratadine (CLARITIN) 10 MG tablet Take 10 mg by mouth daily.     MEGARED OMEGA-3 KRILL OIL 500 MG CAPS Take 1 capsule by mouth daily. (Patient taking differently: Take 500 mg by mouth daily.) 30 capsule 0   Multiple Vitamins-Minerals (MULTIVITAMIN WITH MINERALS) tablet Take 1 tablet by mouth daily.     naproxen sodium (ALEVE) 220 MG tablet Take 220 mg by mouth 2 (two) times daily as needed (for pain or headache).     Roflumilast (ZORYVE) 0.3 % CREA Apply 1 application  topically daily. 60 g 3   No current facility-administered medications for this visit.    Patient confirms/reports the following allergies:  Allergies  Allergen Reactions   Mobic [Meloxicam] Other (See Comments)    Heart race    No orders of the defined types were  placed in this encounter.   AUTHORIZATION INFORMATION Primary Insurance: 1D#: Group #:  Secondary Insurance: 1D#: Group #:  SCHEDULE INFORMATION: Date: 12/31/22 Time: Location: [facility]

## 2022-12-09 ENCOUNTER — Encounter: Payer: Self-pay | Admitting: Family Medicine

## 2022-12-09 LAB — PROLACTIN: Prolactin: 11.6 ng/mL (ref 3.9–22.7)

## 2022-12-09 LAB — CBC WITH DIFFERENTIAL/PLATELET
Basophils Absolute: 0.1 10*3/uL (ref 0.0–0.2)
Basos: 1 %
EOS (ABSOLUTE): 0.3 10*3/uL (ref 0.0–0.4)
Eos: 5 %
Hematocrit: 52.3 % — ABNORMAL HIGH (ref 37.5–51.0)
Hemoglobin: 17.1 g/dL (ref 13.0–17.7)
Immature Grans (Abs): 0 10*3/uL (ref 0.0–0.1)
Immature Granulocytes: 0 %
Lymphocytes Absolute: 1.1 10*3/uL (ref 0.7–3.1)
Lymphs: 22 %
MCH: 27.9 pg (ref 26.6–33.0)
MCHC: 32.7 g/dL (ref 31.5–35.7)
MCV: 85 fL (ref 79–97)
Monocytes Absolute: 0.5 10*3/uL (ref 0.1–0.9)
Monocytes: 9 %
Neutrophils Absolute: 3.3 10*3/uL (ref 1.4–7.0)
Neutrophils: 63 %
Platelets: 278 10*3/uL (ref 150–450)
RBC: 6.13 x10E6/uL — ABNORMAL HIGH (ref 4.14–5.80)
RDW: 13.6 % (ref 11.6–15.4)
WBC: 5.3 10*3/uL (ref 3.4–10.8)

## 2022-12-09 LAB — COMPREHENSIVE METABOLIC PANEL
ALT: 42 [IU]/L (ref 0–44)
AST: 33 [IU]/L (ref 0–40)
Albumin: 4.3 g/dL (ref 4.1–5.1)
Alkaline Phosphatase: 98 [IU]/L (ref 44–121)
BUN/Creatinine Ratio: 19 (ref 9–20)
BUN: 17 mg/dL (ref 6–24)
Bilirubin Total: 0.6 mg/dL (ref 0.0–1.2)
CO2: 25 mmol/L (ref 20–29)
Calcium: 9.3 mg/dL (ref 8.7–10.2)
Chloride: 102 mmol/L (ref 96–106)
Creatinine, Ser: 0.91 mg/dL (ref 0.76–1.27)
Globulin, Total: 2.5 g/dL (ref 1.5–4.5)
Glucose: 96 mg/dL (ref 70–99)
Potassium: 4.6 mmol/L (ref 3.5–5.2)
Sodium: 141 mmol/L (ref 134–144)
Total Protein: 6.8 g/dL (ref 6.0–8.5)
eGFR: 103 mL/min/{1.73_m2} (ref >59)

## 2022-12-09 LAB — HEMOGLOBIN A1C
Est. average glucose Bld gHb Est-mCnc: 111 mg/dL
Hgb A1c MFr Bld: 5.5 % (ref 4.8–5.6)

## 2022-12-09 LAB — LIPID PANEL
Chol/HDL Ratio: 3.6 {ratio} (ref 0.0–5.0)
Cholesterol, Total: 135 mg/dL (ref 100–199)
HDL: 38 mg/dL — ABNORMAL LOW (ref >39)
LDL Chol Calc (NIH): 79 mg/dL (ref 0–99)
Triglycerides: 97 mg/dL (ref 0–149)
VLDL Cholesterol Cal: 18 mg/dL (ref 5–40)

## 2022-12-09 LAB — HEPATITIS C ANTIBODY: Hep C Virus Ab: NONREACTIVE

## 2022-12-09 LAB — HIV ANTIBODY (ROUTINE TESTING W REFLEX): HIV Screen 4th Generation wRfx: NONREACTIVE

## 2022-12-09 LAB — VITAMIN B12

## 2022-12-09 LAB — VITAMIN D 25 HYDROXY (VIT D DEFICIENCY, FRACTURES): Vit D, 25-Hydroxy: 28.2 ng/mL — ABNORMAL LOW (ref 30.0–100.0)

## 2022-12-09 LAB — TSH: TSH: 1.88 u[IU]/mL (ref 0.450–4.500)

## 2022-12-10 ENCOUNTER — Other Ambulatory Visit: Payer: Self-pay | Admitting: Family Medicine

## 2022-12-10 DIAGNOSIS — E291 Testicular hypofunction: Secondary | ICD-10-CM

## 2022-12-10 DIAGNOSIS — E559 Vitamin D deficiency, unspecified: Secondary | ICD-10-CM

## 2022-12-10 MED ORDER — VITAMIN D (ERGOCALCIFEROL) 1.25 MG (50000 UNIT) PO CAPS: 50000.0000 [IU] | ORAL_CAPSULE | ORAL | 1 refills | Status: DC

## 2022-12-13 ENCOUNTER — Encounter: Payer: Self-pay | Admitting: Family Medicine

## 2022-12-13 DIAGNOSIS — E291 Testicular hypofunction: Secondary | ICD-10-CM | POA: Insufficient documentation

## 2022-12-13 DIAGNOSIS — Z114 Encounter for screening for human immunodeficiency virus [HIV]: Secondary | ICD-10-CM | POA: Insufficient documentation

## 2022-12-13 DIAGNOSIS — Z23 Encounter for immunization: Secondary | ICD-10-CM | POA: Insufficient documentation

## 2022-12-13 DIAGNOSIS — Z1211 Encounter for screening for malignant neoplasm of colon: Secondary | ICD-10-CM | POA: Insufficient documentation

## 2022-12-13 DIAGNOSIS — E669 Obesity, unspecified: Secondary | ICD-10-CM | POA: Insufficient documentation

## 2022-12-13 DIAGNOSIS — R7309 Other abnormal glucose: Secondary | ICD-10-CM | POA: Insufficient documentation

## 2022-12-13 DIAGNOSIS — Z1159 Encounter for screening for other viral diseases: Secondary | ICD-10-CM | POA: Insufficient documentation

## 2022-12-13 DIAGNOSIS — Z6837 Body mass index (BMI) 37.0-37.9, adult: Secondary | ICD-10-CM | POA: Insufficient documentation

## 2022-12-13 DIAGNOSIS — E785 Hyperlipidemia, unspecified: Secondary | ICD-10-CM | POA: Insufficient documentation

## 2022-12-13 DIAGNOSIS — E221 Hyperprolactinemia: Secondary | ICD-10-CM | POA: Insufficient documentation

## 2022-12-13 DIAGNOSIS — E538 Deficiency of other specified B group vitamins: Secondary | ICD-10-CM | POA: Insufficient documentation

## 2022-12-13 DIAGNOSIS — E559 Vitamin D deficiency, unspecified: Secondary | ICD-10-CM | POA: Insufficient documentation

## 2022-12-13 HISTORY — DX: Encounter for screening for human immunodeficiency virus (HIV): Z11.4

## 2022-12-13 MED ORDER — TESTOSTERONE 20.25 MG/1.25GM (1.62%) TD GEL: 2.0000 | Freq: Two times a day (BID) | TRANSDERMAL | 0 refills | Status: DC

## 2022-12-13 NOTE — Assessment & Plan Note (Signed)
Refill Testosterone gel 1.62% 2 pumps BID for 30 day supply.  No further refills Referral sent to Urology for continued management

## 2022-12-13 NOTE — Assessment & Plan Note (Signed)
Asymptomatic MRI brain 2018 normal Check prolactin level today Consider Endocrinology evaluation if not completed

## 2022-12-13 NOTE — Assessment & Plan Note (Signed)
Check Vitamin B 12 level 

## 2022-12-13 NOTE — Assessment & Plan Note (Signed)
Check TSH level ?

## 2022-12-13 NOTE — Assessment & Plan Note (Signed)
Encouraged healthy lifestyle Check labs today

## 2022-12-13 NOTE — Assessment & Plan Note (Signed)
Low vitamin D level Start Vitamin D 1.25 mg weekly

## 2022-12-16 ENCOUNTER — Other Ambulatory Visit: Payer: Self-pay

## 2022-12-16 NOTE — Telephone Encounter (Signed)
Medication has been pended for androgel. Per pt mychart msg

## 2022-12-23 ENCOUNTER — Telehealth: Payer: Self-pay

## 2022-12-23 NOTE — Telephone Encounter (Signed)
Pharmacy Patient Advocate Encounter   Received notification from CoverMyMeds that prior authorization for Testosterone 20.25 MG/1.25GM(1.62%) gel  is required/requested.   Insurance verification completed.   The patient is insured through [facility] .   Per test claim: PA required; PA submitted to [facility] via CoverMyMeds Key/confirmation #/EOC [facility] Status is pending

## 2022-12-24 ENCOUNTER — Encounter: Payer: Self-pay | Admitting: Family Medicine

## 2022-12-29 NOTE — Telephone Encounter (Signed)
Noted. Pt should be seeing urology for this medication

## 2022-12-29 NOTE — Telephone Encounter (Signed)
Pharmacy Patient Advocate Encounter  Received notification from [facility] that Prior Authorization for Testosterone 20.25 MG/1.25GM(1.62%) gel has been DENIED.  Full denial letter will be uploaded to the media tab. See denial reason below.   PA #/Case ID/Reference #:  [id number]   The denial was based on our criteria for Testosterone Gel 1.62%.   Per your health plan's criteria, this drug is covered if you meet the following: One of the following:  (1) Your follow-up lab test is within the normal range or still low (for new to therapy: check the total serum testosterone level within the past 6 months; for continuation of therapy: check the serum testosterone level within 12 months).  (2) Your follow-up lab test is higher than the normal range and your drug dose has changed (for new to therapy: check the total serum testosterone level within the past 6 months; for continuation of therapy: check the serum testosterone level within 12 months).  (3) One of the following:  (I) Your follow-up lab test is within the normal range or still low (for new to therapy: check the calculated free or bioavailable testosterone level within the past 6 months; for continuation of therapy: check the calculated free or bioavailable testosterone level within 12 months).  (II) Your follow-up lab test is higher than the normal range and your drug dose has changed (for new to therapy: check the calculated free or bioavailable testosterone level within the past 6 months; for continuation of therapy: check the calculated free or bioavailable testosterone level within 12 months).

## 2022-12-31 ENCOUNTER — Ambulatory Visit: Payer: Managed Care, Other (non HMO) | Admitting: Certified Registered"

## 2022-12-31 ENCOUNTER — Encounter
Admission: RE | Disposition: A | Discharge status: Home / Self Care | Payer: Self-pay | Source: Home / Self Care | Attending: Gastroenterology

## 2022-12-31 ENCOUNTER — Ambulatory Visit
Admission: RE | Admit: 2022-12-31 | Discharge: 2022-12-31 | Disposition: A | Discharge status: Home / Self Care | Payer: Managed Care, Other (non HMO) | Attending: Gastroenterology | Admitting: Gastroenterology

## 2022-12-31 ENCOUNTER — Other Ambulatory Visit: Payer: Self-pay

## 2022-12-31 ENCOUNTER — Encounter: Payer: Self-pay | Admitting: Gastroenterology

## 2022-12-31 DIAGNOSIS — D124 Benign neoplasm of descending colon: Secondary | ICD-10-CM | POA: Diagnosis not present

## 2022-12-31 DIAGNOSIS — Z1211 Encounter for screening for malignant neoplasm of colon: Secondary | ICD-10-CM

## 2022-12-31 DIAGNOSIS — G4730 Sleep apnea, unspecified: Secondary | ICD-10-CM | POA: Insufficient documentation

## 2022-12-31 HISTORY — PX: COLONOSCOPY WITH PROPOFOL: SHX5780

## 2022-12-31 SURGERY — COLONOSCOPY WITH PROPOFOL
Anesthesia: General

## 2022-12-31 MED ORDER — STERILE WATER FOR IRRIGATION IR SOLN
Status: DC | PRN
  Administered 2022-12-31: 120 mL

## 2022-12-31 MED ORDER — PROPOFOL 500 MG/50ML IV EMUL
INTRAVENOUS | Status: DC | PRN
  Administered 2022-12-31: 140 ug/kg/min via INTRAVENOUS

## 2022-12-31 MED ORDER — PROPOFOL 10 MG/ML IV BOLUS
INTRAVENOUS | Status: DC | PRN
  Administered 2022-12-31: 70 mg via INTRAVENOUS

## 2022-12-31 MED ORDER — SODIUM CHLORIDE 0.9 % IV SOLN: INTRAVENOUS | Status: DC

## 2022-12-31 MED ORDER — LIDOCAINE HCL (CARDIAC) PF 100 MG/5ML IV SOSY
PREFILLED_SYRINGE | INTRAVENOUS | Status: DC | PRN
  Administered 2022-12-31: 50 mg via INTRAVENOUS

## 2022-12-31 NOTE — Plan of Care (Signed)
[facility] Tonsillectomy/Adenoidectomy, Postoperative PEDS care plan entered in error.

## 2022-12-31 NOTE — Op Note (Signed)
[facility] Gastroenterology Patient Name: Cameron Hood Procedure Date: 12/31/2022 12:07 PM MRN: [id number] Account #: [id number] Date of Birth: [birth year] Admit Type: Outpatient Age: 47 Room: [facility] ENDO ROOM 3 Gender: Male Note Status: Finalized Instrument Name: Nelda Marseille [id number] Procedure:             Colonoscopy Indications:           Screening for colorectal malignant neoplasm, This is                         the patient's first colonoscopy Providers:             Toney Reil MD, MD Referring MD:          Dana Allan (Referring MD) Medicines:             General Anesthesia Complications:         No immediate complications. Estimated blood loss: None. Procedure:             Pre-Anesthesia Assessment:                        - Prior to the procedure, a History and Physical was                         performed, and patient medications and allergies were                         reviewed. The patient is competent. The risks and                         benefits of the procedure and the sedation options and                         risks were discussed with the patient. All questions                         were answered and informed consent was obtained.                         Patient identification and proposed procedure were                         verified by the physician, the nurse, the                         anesthesiologist, the anesthetist and the technician                         in the pre-procedure area in the procedure room in the                         endoscopy suite. Mental Status Examination: alert and                         oriented. Airway Examination: normal oropharyngeal                         airway and neck mobility. Respiratory Examination:  clear to auscultation. CV Examination: normal.                         Prophylactic Antibiotics: The patient does not require                         prophylactic  antibiotics. Prior Anticoagulants: The                         patient has taken no anticoagulant or antiplatelet                         agents. ASA Grade Assessment: III - A patient with                         severe systemic disease. After reviewing the risks and                         benefits, the patient was deemed in satisfactory                         condition to undergo the procedure. The anesthesia                         plan was to use general anesthesia. Immediately prior                         to administration of medications, the patient was                         re-assessed for adequacy to receive sedatives. The                         heart rate, respiratory rate, oxygen saturations,                         blood pressure, adequacy of pulmonary ventilation, and                         response to care were monitored throughout the                         procedure. The physical status of the patient was                         re-assessed after the procedure.                        After obtaining informed consent, the colonoscope was                         passed under direct vision. Throughout the procedure,                         the patient's blood pressure, pulse, and oxygen                         saturations were monitored continuously. The  Colonoscope was introduced through the anus and                         advanced to the the cecum, identified by appendiceal                         orifice and ileocecal valve. The colonoscopy was                         performed without difficulty. The patient tolerated                         the procedure well. The quality of the bowel                         preparation was evaluated using the BBPS [facility] Bowel                         Preparation Scale) with scores of: Right Colon = 3,                         Transverse Colon = 3 and Left Colon = 3 (entire mucosa                         seen  well with no residual staining, small fragments                         of stool or opaque liquid). The total BBPS score                         equals 9. The ileocecal valve, appendiceal orifice,                         and rectum were photographed. Findings:      The perianal and digital rectal examinations were normal. Pertinent       negatives include normal sphincter tone and no palpable rectal lesions.      Two sessile polyps were found in the descending colon. The polyps were 5       to 6 mm in size. These polyps were removed with a cold snare. Resection       and retrieval were complete. Estimated blood loss was minimal.      The retroflexed view of the distal rectum and anal verge was normal and       showed no anal or rectal abnormalities. Impression:            - Two 5 to 6 mm polyps in the descending colon,                         removed with a cold snare. Resected and retrieved.                        - The distal rectum and anal verge are normal on                         retroflexion view. Recommendation:        - Discharge patient to home (with escort).                        -  Resume previous diet today.                        - Continue present medications.                        - Await pathology results.                        - Repeat colonoscopy in 3 - 5 years for surveillance                         based on pathology results. Procedure Code(s):     --- Professional ---                        [phone], Colonoscopy, flexible; with removal of                         tumor(s), polyp(s), or other lesion(s) by snare                         technique Diagnosis Code(s):     --- Professional ---                        Z12.11, Encounter for screening for malignant neoplasm                         of colon                        D12.4, Benign neoplasm of descending colon CPT copyright 2022 American Medical Association. All rights reserved. The codes documented in this report  are preliminary and upon coder review may  be revised to meet current compliance requirements. Dr. Libby Maw Toney Reil MD, MD 12/31/2022 12:43:56 PM This report has been signed electronically. Number of Addenda: 0 Note Initiated On: 12/31/2022 12:07 PM Scope Withdrawal Time: 0 hours 12 minutes 53 seconds  Total Procedure Duration: 0 hours 15 minutes 24 seconds  Estimated Blood Loss:  Estimated blood loss: none.      [facility]

## 2022-12-31 NOTE — Anesthesia Preprocedure Evaluation (Signed)
Anesthesia Evaluation  Patient identified by MRN, date of birth, ID band Patient awake    Reviewed: Allergy & Precautions, NPO status , Patient's Chart, lab work & pertinent test results  History of Anesthesia Complications Negative for: history of anesthetic complications  Airway Mallampati: III  TM Distance: >3 FB Neck ROM: full    Dental  (+) Chipped   Pulmonary neg shortness of breath, sleep apnea    Pulmonary exam normal        Cardiovascular Exercise Tolerance: Good negative cardio ROS Normal cardiovascular exam     Neuro/Psych negative neurological ROS  negative psych ROS   GI/Hepatic Neg liver ROS,GERD  Controlled,,  Endo/Other  Hypothyroidism    Renal/GU negative Renal ROS  negative genitourinary   Musculoskeletal   Abdominal   Peds  Hematology negative hematology ROS (+)   Anesthesia Other Findings Past Medical History: No date: Disseminated Lyme disease No date: GERD (gastroesophageal reflux disease)     Comment:  occ tx w/ tums No date: Low testosterone No date: Lumbago  Past Surgical History: 02/26/2017: ANKLE FUSION; Left     Comment:  Procedure: LEFT SUBTALAR AND TALONAVICULAR FUSION;                Surgeon: Nadara Mustard, MD;  Location: [facility] OR;  Service:               Orthopedics;  Laterality: Left; No date: ANKLE SURGERY; Right 02/26/2017: FUSION OF TALONAVICULAR JOINT; Left  BMI    Body Mass Index: 36.13 kg/m      Reproductive/Obstetrics negative OB ROS                             Anesthesia Physical Anesthesia Plan  ASA: 3  Anesthesia Plan: General   Post-op Pain Management:    Induction: Intravenous  PONV Risk Score and Plan: Propofol infusion and TIVA  Airway Management Planned: Natural Airway and Nasal Cannula  Additional Equipment:   Intra-op Plan:   Post-operative Plan:   Informed Consent: I have reviewed the patients History and  Physical, chart, labs and discussed the procedure including the risks, benefits and alternatives for the proposed anesthesia with the patient or authorized representative who has indicated his/her understanding and acceptance.     Dental Advisory Given  Plan Discussed with: Anesthesiologist, CRNA and Surgeon  Anesthesia Plan Comments: (Patient consented for risks of anesthesia including but not limited to:  - adverse reactions to medications - risk of airway placement if required - damage to eyes, teeth, lips or other oral mucosa - nerve damage due to positioning  - sore throat or hoarseness - Damage to heart, brain, nerves, lungs, other parts of body or loss of life  Patient voiced understanding and assent.)       Anesthesia Quick Evaluation

## 2022-12-31 NOTE — Transfer of Care (Signed)
Immediate Anesthesia Transfer of Care Note  Patient: Cameron Hood  Procedure(s) Performed: COLONOSCOPY WITH PROPOFOL  Patient Location: PACU  Anesthesia Type:MAC  Level of Consciousness: drowsy  Airway & Oxygen Therapy: Patient Spontanous Breathing  Post-op Assessment: Report given to RN and Post -op Vital signs reviewed and stable  Post vital signs: stable  Last Vitals:  Vitals Value Taken Time  BP 122/84 12/31/22 1245  Temp    Pulse 83 12/31/22 1246  Resp 21 12/31/22 1246  SpO2 100 % 12/31/22 1246  Vitals shown include unfiled device data.  Last Pain:  Vitals:   12/31/22 1154  TempSrc: Temporal  PainSc: 0-No pain         Complications: No notable events documented.

## 2022-12-31 NOTE — H&P (Signed)
Arlyss Repress, MD [address]  [address]  [place], [place] [address]  Main: [phone]  Fax: [phone] Pager: [phone]  Primary Care Physician:  Dana Allan, MD Primary Gastroenterologist:  Dr. Arlyss Repress  Pre-Procedure History & Physical: HPI:  Cameron Hood is a 47 y.o. male is here for an colonoscopy.   Past Medical History:  Diagnosis Date   Disseminated Lyme disease    GERD (gastroesophageal reflux disease)    occ tx w/ tums   Low testosterone    Lumbago     Past Surgical History:  Procedure Laterality Date   ANKLE FUSION Left 02/26/2017   Procedure: LEFT SUBTALAR AND TALONAVICULAR FUSION;  Surgeon: Nadara Mustard, MD;  Location: [facility] OR;  Service: Orthopedics;  Laterality: Left;   ANKLE SURGERY Right    FUSION OF TALONAVICULAR JOINT Left 02/26/2017    Prior to Admission medications   Medication Sig Start Date End Date Taking? Authorizing Provider  loratadine (CLARITIN) 10 MG tablet Take 10 mg by mouth daily.   Yes [provider]  MEGARED OMEGA-3 KRILL OIL 500 MG CAPS Take 1 capsule by mouth daily. Patient taking differently: Take 500 mg by mouth daily. 12/04/15  Yes Sowles, Danna Hefty, MD  Multiple Vitamins-Minerals (MULTIVITAMIN WITH MINERALS) tablet Take 1 tablet by mouth daily.   Yes [provider]  naproxen sodium (ALEVE) 220 MG tablet Take 220 mg by mouth 2 (two) times daily as needed (for pain or headache).   Yes [provider]  Testosterone 20.25 MG/1.25GM (1.62%) GEL Apply 2 Pump topically 2 (two) times daily. 12/13/22 01/12/23 Yes Dana Allan, MD  Vitamin D, Ergocalciferol, (DRISDOL) 1.25 MG (50000 UNIT) CAPS capsule Take 1 capsule (50,000 Units total) by mouth every 7 (seven) days. 12/10/22  Yes Dana Allan, MD  acetaminophen (TYLENOL) 500 MG tablet Take 1,000 mg by mouth every 6 (six) hours as needed for moderate pain or headache.    [provider]  Roflumilast (ZORYVE) 0.3 % CREA Apply 1 application   topically daily. 02/09/22   Deirdre Evener, MD    Allergies as of 12/08/2022 - Review Complete 12/04/2022  Allergen Reaction Noted   Mobic [meloxicam] Other (See Comments) 07/20/2013    Family History  Problem Relation Age of Onset   Cancer Mother    Anxiety disorder Mother    Hypertension Father    Diabetes Father    Early death Maternal Grandfather    Depression Paternal Grandmother    Cancer Paternal Grandmother        unsure maybe pancreatic    Early death Paternal Grandfather     Social History   Socioeconomic History   Marital status: Married    Spouse name: 1   Number of children: 1   Years of education: Not on file   Highest education level: Not on file  Occupational History   Occupation: Curator  Tobacco Use   Smoking status: Never   Smokeless tobacco: Never  Vaping Use   Vaping status: Never Used  Substance and Sexual Activity   Alcohol use: Not Currently    Comment: rarely   Drug use: No   Sexual activity: Yes    Partners: Female  Other Topics Concern   Not on file  Social History Narrative   Married   Works at Environmental manager, fixes cars, boats, fleet trucks   Also works part time at [employer] part time   One grown child   Social Determinants of  Health   Financial Resource Strain: Not on file  Food Insecurity: Not on file  Transportation Needs: Not on file  Physical Activity: Not on file  Stress: Not on file  Social Connections: Not on file  Intimate Partner Violence: Not on file    Review of Systems: See HPI, otherwise negative ROS  Physical Exam: BP 127/85   Pulse 67   Temp (!) 97.4 F (36.3 C) (Temporal)   Resp 16   Ht 5\' 10"  (1.778 m)   Wt 114.2 kg   SpO2 98%   BMI 36.13 kg/m  General:   Alert,  pleasant and cooperative in NAD Head:  Normocephalic and atraumatic. Neck:  Supple; no masses or thyromegaly. Lungs:  Clear throughout to auscultation.    Heart:  Regular rate and rhythm. Abdomen:  Soft,  nontender and nondistended. Normal bowel sounds, without guarding, and without rebound.   Neurologic:  Alert and  oriented x4;  grossly normal neurologically.  Impression/Plan: Cameron Hood is here for an colonoscopy to be performed for colon cancer screening  Risks, benefits, limitations, and alternatives regarding  colonoscopy have been reviewed with the patient.  Questions have been answered.  All parties agreeable.   Lannette Donath, MD  12/31/2022, 12:08 PM

## 2023-01-01 ENCOUNTER — Other Ambulatory Visit: Payer: Self-pay | Admitting: Medical Genetics

## 2023-01-01 DIAGNOSIS — Z006 Encounter for examination for normal comparison and control in clinical research program: Secondary | ICD-10-CM

## 2023-01-01 LAB — SURGICAL PATHOLOGY

## 2023-01-01 NOTE — Anesthesia Postprocedure Evaluation (Signed)
Anesthesia Post Note  Patient: Cameron Hood  Procedure(s) Performed: COLONOSCOPY WITH PROPOFOL  Patient location during evaluation: Endoscopy Anesthesia Type: General Level of consciousness: awake and alert Pain management: pain level controlled Vital Signs Assessment: post-procedure vital signs reviewed and stable Respiratory status: spontaneous breathing, nonlabored ventilation, respiratory function stable and patient connected to nasal cannula oxygen Cardiovascular status: blood pressure returned to baseline and stable Postop Assessment: no apparent nausea or vomiting Anesthetic complications: no   No notable events documented.   Last Vitals:  Vitals:   12/31/22 1257 12/31/22 1305  BP: 123/84 (!) 131/90  Pulse: 72 61  Resp: 17 13  Temp:    SpO2: 97% 99%    Last Pain:  Vitals:   12/31/22 1257  TempSrc:   PainSc: 0-No pain                 Cleda Mccreedy Olivia Pavelko

## 2023-01-04 ENCOUNTER — Encounter: Payer: Self-pay | Admitting: Gastroenterology

## 2023-01-16 ENCOUNTER — Other Ambulatory Visit: Payer: Self-pay | Admitting: Family Medicine

## 2023-01-16 DIAGNOSIS — E291 Testicular hypofunction: Secondary | ICD-10-CM

## 2023-01-18 ENCOUNTER — Other Ambulatory Visit: Payer: Self-pay

## 2023-01-18 MED ORDER — TESTOSTERONE 20.25 MG/1.25GM (1.62%) TD GEL: 2.0000 | Freq: Two times a day (BID) | TRANSDERMAL | 0 refills | Status: DC

## 2023-01-19 ENCOUNTER — Telehealth: Payer: Self-pay

## 2023-01-19 NOTE — Telephone Encounter (Signed)
Darl Pikes called from [facility] in [place] to state they received a prescription for Testosterone 20.25 MG/1.25GM (1.62%) GEL, which is for the packets, but patient has been on the 1.62% pump.  Darl Pikes states she would like to know if we will send her a prescription for the pump.

## 2023-01-22 ENCOUNTER — Other Ambulatory Visit: Payer: Self-pay | Admitting: Family Medicine

## 2023-01-22 DIAGNOSIS — E291 Testicular hypofunction: Secondary | ICD-10-CM

## 2023-01-22 MED ORDER — TESTOSTERONE 1.62 % TD GEL: 2.0000 | Freq: Two times a day (BID) | TRANSDERMAL | 0 refills | Status: DC

## 2023-01-22 NOTE — Telephone Encounter (Signed)
Called the pharmacy and she stated that you need to order Testosterone 1.62% 20.25mg  but not the one that says 20.25MG /1.25 mg

## 2023-01-23 ENCOUNTER — Encounter: Payer: Self-pay | Admitting: Dermatology

## 2023-01-23 DIAGNOSIS — L409 Psoriasis, unspecified: Secondary | ICD-10-CM

## 2023-01-25 MED ORDER — ZORYVE 0.3 % EX CREA: 1.0000 | TOPICAL_CREAM | Freq: Every day | CUTANEOUS | 3 refills | Status: DC

## 2023-01-28 LAB — TESTOSTERONE: Testosterone: 387 ng/dL (ref 264–916)

## 2023-02-02 ENCOUNTER — Ambulatory Visit: Payer: Managed Care, Other (non HMO) | Admitting: Urology

## 2023-02-02 ENCOUNTER — Other Ambulatory Visit: Payer: Self-pay | Attending: Medical Genetics

## 2023-02-02 ENCOUNTER — Telehealth: Payer: Self-pay

## 2023-02-02 ENCOUNTER — Encounter: Payer: Self-pay | Admitting: Urology

## 2023-02-02 VITALS — BP 158/92 | HR 62 | Ht 70.0 in | Wt 260.0 lb

## 2023-02-02 DIAGNOSIS — E291 Testicular hypofunction: Secondary | ICD-10-CM

## 2023-02-02 MED ORDER — TESTOSTERONE 1.62 % TD GEL: 6.0000 | Freq: Every day | TRANSDERMAL | 5 refills | Status: DC

## 2023-02-02 NOTE — Progress Notes (Signed)
   02/02/23 1:39 PM   Cathlean Marseilles 03-10-1975 [id number]  CC: Low testosterone, PSA screening  HPI: 47 year old male who has been on testosterone replacement for at least 10 years that was initially started by PCP.  He was referred to urology because PCP was uncomfortable continuing to fill testosterone.  He currently is using 6 pumps a day of generic testosterone 1.62% gel.  His initial symptoms of low testosterone were low energy and mood swings/depression, symptoms resolved with testosterone replacement.  He denies any urinary symptoms or problems with erections.  Most recent testosterone level from November 2024 was 387.  He would like to continue testosterone at the current dosing.  He reports his PSA has been very low close to 0 when monitored previously on testosterone.  He denies any urinary symptoms, he denies any problems with ED.   PMH: Past Medical History:  Diagnosis Date   Disseminated Lyme disease    GERD (gastroesophageal reflux disease)    occ tx w/ tums   Low testosterone    Lumbago     Surgical History: Past Surgical History:  Procedure Laterality Date   ANKLE FUSION Left 02/26/2017   Procedure: LEFT SUBTALAR AND TALONAVICULAR FUSION;  Surgeon: Nadara Mustard, MD;  Location: [facility] OR;  Service: Orthopedics;  Laterality: Left;   ANKLE SURGERY Right    COLONOSCOPY WITH PROPOFOL N/A 12/31/2022   Procedure: COLONOSCOPY WITH PROPOFOL;  Surgeon: Toney Reil, MD;  Location: [facility] ENDOSCOPY;  Service: Gastroenterology;  Laterality: N/A;   FUSION OF TALONAVICULAR JOINT Left 02/26/2017    Family History: Family History  Problem Relation Age of Onset   Cancer Mother    Anxiety disorder Mother    Hypertension Father    Diabetes Father    Early death Maternal Grandfather    Depression Paternal Grandmother    Cancer Paternal Grandmother        unsure maybe pancreatic    Early death Paternal Grandfather     Social History:  reports that he has never smoked. He  has never used smokeless tobacco. He reports that he does not currently use alcohol. He reports that he does not use drugs.  Physical Exam: BP (!) 158/92 (BP Location: Left Arm, Patient Position: Sitting, Cuff Size: Large)   Pulse 62   Ht 5\' 10"  (1.778 m)   Wt 260 lb (117.9 kg)   BMI 37.31 kg/m    Constitutional:  Alert and oriented, No acute distress. Cardiovascular: No clubbing, cyanosis, or edema. Respiratory: Normal respiratory effort, no increased work of breathing. GI: Abdomen is soft, nontender, nondistended, no abdominal masses   Assessment & Plan:   47 year old male who has been on exogenous testosterone for at least 10 years that was initially started by PCP.  He has had significant improvement in his symptoms of low energy, fatigue, and mood swings on the testosterone would like to continue that medication.  Risk and benefits of testosterone were discussed, as well as AUA guidelines regarding evaluation and management.  Outside records were reviewed.  Testosterone refilled, continue 6 pumps daily RTC 1 year PSA, H/H, testosterone prior    Legrand Rams, MD 02/02/2023  [facility] [address] [place], [place] [address] [phone]

## 2023-02-02 NOTE — Telephone Encounter (Signed)
PA for testosterone 1.62% completed and approved.   Dates: 02/02/23 - 02/02/24  Auth# [id number]

## 2023-03-19 ENCOUNTER — Other Ambulatory Visit: Payer: Self-pay | Admitting: Family Medicine

## 2023-03-19 DIAGNOSIS — E559 Vitamin D deficiency, unspecified: Secondary | ICD-10-CM

## 2023-05-20 ENCOUNTER — Ambulatory Visit: Payer: Managed Care, Other (non HMO) | Admitting: Dermatology

## 2023-07-02 ENCOUNTER — Other Ambulatory Visit: Payer: Self-pay | Admitting: Family Medicine

## 2023-07-02 ENCOUNTER — Telehealth: Payer: Self-pay

## 2023-07-02 DIAGNOSIS — E559 Vitamin D deficiency, unspecified: Secondary | ICD-10-CM

## 2023-07-02 NOTE — Telephone Encounter (Signed)
Pt needs a TOC appt scheduled.

## 2023-07-06 ENCOUNTER — Ambulatory Visit: Admitting: Dermatology

## 2023-08-09 ENCOUNTER — Encounter: Payer: Self-pay | Admitting: Dermatology

## 2023-08-09 ENCOUNTER — Ambulatory Visit: Admitting: Dermatology

## 2023-08-09 DIAGNOSIS — Z79899 Other long term (current) drug therapy: Secondary | ICD-10-CM

## 2023-08-09 DIAGNOSIS — Z7189 Other specified counseling: Secondary | ICD-10-CM

## 2023-08-09 DIAGNOSIS — L409 Psoriasis, unspecified: Secondary | ICD-10-CM | POA: Diagnosis not present

## 2023-08-09 MED ORDER — CALCIPOTRIENE 0.005 % EX CREA: TOPICAL_CREAM | CUTANEOUS | 6 refills | Status: DC

## 2023-08-09 MED ORDER — ZORYVE 0.3 % EX CREA: 1.0000 | TOPICAL_CREAM | Freq: Every day | CUTANEOUS | 6 refills | Status: DC

## 2023-08-09 NOTE — Progress Notes (Signed)
   Follow-Up Visit   Subjective  Cameron Hood is a 48 y.o. male who presents for the following: Yearly f/u on Psoriasis on his legs, treating with Zoryve  cream and TMC with a fair response.  The following portions of the chart were reviewed this encounter and updated as appropriate: medications, allergies, medical history  Review of Systems:  No other skin or systemic complaints except as noted in HPI or Assessment and Plan.  Objective  Well appearing patient in no apparent distress; mood and affect are within normal limits.  Areas Examined: Face, arms, legs   Relevant exam findings are noted in the Assessment and Plan.   Assessment & Plan   COUNSELING AND COORDINATION OF CARE   PSORIASIS   Related Medications Roflumilast  (ZORYVE ) 0.3 % CREA Apply 1 application  topically daily. MEDICATION MANAGEMENT    PSORIASIS Left leg, right leg  Well-demarcated erythematous papules/plaques with silvery scale, guttate pink scaly papules. 9% BSA. Treatment Plan: Continue Zoryve  cream qd-bid  Start Calcipotriene cream apply to legs qd-bid    Counseling on psoriasis and coordination of care  psoriasis is a chronic non-curable, but treatable genetic/hereditary disease that may have other systemic features affecting other organ systems such as joints (Psoriatic Arthritis). It is associated with an increased risk of inflammatory bowel disease, heart disease, non-alcoholic fatty liver disease, and depression.  Treatments include light and laser treatments; topical medications; and systemic medications including oral and injectables.  May consider Otezla tablets or injectable treatment in the future  Pt declines systemic treatment at this time  Return in about 7 months (around 03/10/2024) for Psoriasis .  IClara Crisp, CMA, am acting as scribe for Celine Collard, MD .   Documentation: I have reviewed the above documentation for accuracy and completeness, and I agree with the  above.  Celine Collard, MD

## 2023-08-09 NOTE — Patient Instructions (Signed)

## 2023-08-16 ENCOUNTER — Encounter: Payer: Self-pay | Admitting: Dermatology

## 2023-08-17 ENCOUNTER — Other Ambulatory Visit: Payer: Self-pay

## 2023-08-17 DIAGNOSIS — L409 Psoriasis, unspecified: Secondary | ICD-10-CM

## 2023-08-17 MED ORDER — ZORYVE 0.3 % EX CREA: 1.0000 | TOPICAL_CREAM | Freq: Every day | CUTANEOUS | 6 refills | Status: AC

## 2023-08-17 NOTE — Progress Notes (Signed)
 Transfer per patient request. aw

## 2023-08-30 ENCOUNTER — Other Ambulatory Visit: Payer: Self-pay

## 2023-08-30 DIAGNOSIS — E291 Testicular hypofunction: Secondary | ICD-10-CM

## 2023-09-02 MED ORDER — TESTOSTERONE 1.62 % TD GEL: 6.0000 | Freq: Every day | TRANSDERMAL | 5 refills | Status: DC

## 2023-09-22 ENCOUNTER — Other Ambulatory Visit: Payer: Self-pay | Admitting: Family Medicine

## 2023-09-22 DIAGNOSIS — E559 Vitamin D deficiency, unspecified: Secondary | ICD-10-CM

## 2023-10-05 ENCOUNTER — Encounter: Payer: Self-pay | Admitting: Urology

## 2023-11-05 ENCOUNTER — Encounter: Admitting: Nurse Practitioner

## 2023-11-22 ENCOUNTER — Ambulatory Visit

## 2023-11-22 VITALS — BP 125/80 | HR 66 | Temp 97.6°F | Ht 70.0 in | Wt 273.2 lb

## 2023-11-22 DIAGNOSIS — E538 Deficiency of other specified B group vitamins: Secondary | ICD-10-CM

## 2023-11-22 DIAGNOSIS — R7309 Other abnormal glucose: Secondary | ICD-10-CM | POA: Diagnosis not present

## 2023-11-22 DIAGNOSIS — E66812 Obesity, class 2: Secondary | ICD-10-CM

## 2023-11-22 DIAGNOSIS — Z6837 Body mass index (BMI) 37.0-37.9, adult: Secondary | ICD-10-CM | POA: Diagnosis not present

## 2023-11-22 DIAGNOSIS — E785 Hyperlipidemia, unspecified: Secondary | ICD-10-CM | POA: Diagnosis not present

## 2023-11-22 DIAGNOSIS — R0789 Other chest pain: Secondary | ICD-10-CM

## 2023-11-22 DIAGNOSIS — E559 Vitamin D deficiency, unspecified: Secondary | ICD-10-CM

## 2023-11-22 DIAGNOSIS — E291 Testicular hypofunction: Secondary | ICD-10-CM

## 2023-11-22 DIAGNOSIS — H1013 Acute atopic conjunctivitis, bilateral: Secondary | ICD-10-CM

## 2023-11-22 DIAGNOSIS — Z23 Encounter for immunization: Secondary | ICD-10-CM | POA: Insufficient documentation

## 2023-11-22 DIAGNOSIS — R030 Elevated blood-pressure reading, without diagnosis of hypertension: Secondary | ICD-10-CM

## 2023-11-22 DIAGNOSIS — R0683 Snoring: Secondary | ICD-10-CM

## 2023-11-22 NOTE — Progress Notes (Signed)
 Established Patient Office Visit TOC from Dr. Hope    Subjective  Patient ID: Cameron Hood, male    DOB: 1975/04/08  Age: 48 y.o. MRN: [id number]  Chief Complaint  Patient presents with   Establish Care   Chest Pain   Hypertension    He  has a past medical history of Disseminated Lyme disease, Encounter for screening for HIV (12/13/2022), GERD (gastroesophageal reflux disease), History of Lyme disease (10/31/2014), Low testosterone , and Lumbago.  HPI Discussed the use of AI scribe software for clinical note transcription with the patient, who gave verbal consent to proceed.  History of Present Illness Cameron Hood is a 48 year old male who presents for TOC from previous PCP and has following concerns:  - Elevated blood pressure reading at home:  He has been experiencing elevated blood pressure readings at home, ranging from 138/85 mmHg to as high as 160/high 90s mmHg. He has not been previously diagnosed with hypertension nor has he been on medication for blood pressure control. His father has a history of high blood pressure and is on medication for it.   - Allergic conjunctivitis, atypical chest pain when on OTC Zyrtec: Patient reports allergic conjunctivitis was taking Claritin for years. When he felt like symptoms were not controlled with OTC Allegra, he switched OTC antihistamine to Zyrtec.  He has experienced atypical chest pain, which he attributes to a possible side effect of Zyrtec, which he stopped taking four days ago. Since discontinuing Zyrtec, he has not experienced a recurrence of chest pain. He has used Pataday eye drops for relief and finds them effective.  - H/O vitamin D  insufficiency: He also takes a multivitamin and D3 at a dose of 10,000 units daily, which he purchased himself after running out of the prescribed version.  - Low testosterone : On Testosterone , in care with urologist Dr. Francisca.  - Snoring: He reports feeling tired during the day and has  a history of snoring, although his wife has not consistently complained about it. He underwent a sleep study over 15 years ago but was unable to complete it due to discomfort with the equipment.  - Obesity:  He reports rare alcohol intake, estimating that he consumes a twelve-pack of alcohol annually. His diet consists of home-cooked meals with meat and vegetables, and he avoids fast food. He drinks sweet tea or flavored water  instead of soda. He does not engage in regular exercise, citing work as his primary activity.   ROS As per HPI    Objective:     BP 125/80 (BP Location: Left Arm, Cuff Size: Large)   Pulse 66   Temp 97.6 F (36.4 C) (Oral)   Ht 5' 10 (1.778 m)   Wt 273 lb 3.2 oz (123.9 kg)   SpO2 97%   BMI 39.20 kg/m      11/22/2023    9:03 AM 12/04/2022    9:05 AM 12/07/2016    8:52 AM  Depression screen PHQ 2/9  Decreased Interest 0 0 0  Down, Depressed, Hopeless 0 0 0  PHQ - 2 Score 0 0 0  Altered sleeping 1 1   Tired, decreased energy 0 1   Change in appetite 0 0   Feeling bad or failure about yourself  0 0   Trouble concentrating 0 0   Moving slowly or fidgety/restless 0 0   Suicidal thoughts 0 0   PHQ-9 Score 1 2   Difficult doing work/chores Not difficult at all Not  difficult at all       11/22/2023    9:03 AM 12/04/2022    9:05 AM  GAD 7 : Generalized Anxiety Score  Nervous, Anxious, on Edge 0 0  Control/stop worrying 0 0  Worry too much - different things 0 0  Trouble relaxing 0 0  Restless 0 0  Easily annoyed or irritable 0 0  Afraid - awful might happen 1 0  Total GAD 7 Score 1 0  Anxiety Difficulty Not difficult at all Not difficult at all      11/22/2023    9:03 AM 12/04/2022    9:05 AM 12/07/2016    8:52 AM  Depression screen PHQ 2/9  Decreased Interest 0 0 0  Down, Depressed, Hopeless 0 0 0  PHQ - 2 Score 0 0 0  Altered sleeping 1 1   Tired, decreased energy 0 1   Change in appetite 0 0   Feeling bad or failure about yourself  0 0    Trouble concentrating 0 0   Moving slowly or fidgety/restless 0 0   Suicidal thoughts 0 0   PHQ-9 Score 1 2   Difficult doing work/chores Not difficult at all Not difficult at all       11/22/2023    9:03 AM 12/04/2022    9:05 AM  GAD 7 : Generalized Anxiety Score  Nervous, Anxious, on Edge 0 0  Control/stop worrying 0 0  Worry too much - different things 0 0  Trouble relaxing 0 0  Restless 0 0  Easily annoyed or irritable 0 0  Afraid - awful might happen 1 0  Total GAD 7 Score 1 0  Anxiety Difficulty Not difficult at all Not difficult at all   SDOH Screenings   Food Insecurity: No Food Insecurity (11/22/2023)  Housing: Unknown (11/22/2023)  Transportation Needs: No Transportation Needs (11/22/2023)  Alcohol Screen: Low Risk  (11/22/2023)  Depression (PHQ2-9): Low Risk  (11/22/2023)  Financial Resource Strain: Low Risk  (11/22/2023)  Physical Activity: Inactive (11/22/2023)  Social Connections: Socially Isolated (11/22/2023)  Stress: No Stress Concern Present (11/22/2023)  Tobacco Use: Low Risk  (11/22/2023)     Physical Exam Constitutional:      General: He is not in acute distress.    Appearance: He is obese.  HENT:     Head: Normocephalic and atraumatic.     Mouth/Throat:     Pharynx: Oropharynx is clear. No oropharyngeal exudate.  Cardiovascular:     Rate and Rhythm: Normal rate.     Pulses: Normal pulses.     Heart sounds: No murmur heard. Pulmonary:     Effort: Pulmonary effort is normal.     Breath sounds: Normal breath sounds.  Abdominal:     General: Abdomen is protuberant. Bowel sounds are normal.     Palpations: Abdomen is soft.  Musculoskeletal:     Cervical back: Neck supple. No tenderness.     Right lower leg: No edema.     Left lower leg: No edema.  Skin:    General: Skin is warm.  Neurological:     Mental Status: He is alert and oriented to person, place, and time.  Psychiatric:        Mood and Affect: Mood normal.        No results found  for any visits on 11/22/23.  The 10-year ASCVD risk score (Arnett DK, et al., 2019) is: 2.6%     Assessment & Plan:   Class 2 obesity  without serious comorbidity with body mass index (BMI) of 37.0 to 37.9 in adult, unspecified obesity type Assessment & Plan: Elevated BP, low HDL, concern for OSA. Goal weight 215 pounds. Current weight 273 pounds.  Discussed extensive lifestyle modifications with: moderate intensity exercise daily for 30 min/at least 150 min per week, calorie deficit, healthy diet. Consider weight loss medication if lifestyle changes insufficient. No family h/o thyroid cancer. F/U in 3 months.    Orders: -     Comprehensive metabolic panel with GFR  Abnormal glucose Assessment & Plan: Check A1c.   Orders: -     Hemoglobin A1c  Hyperlipidemia, unspecified hyperlipidemia type Assessment & Plan: Previous labs: low HDL. Discussed diet and exercise for cholesterol management. Check cholesterol levels.  Orders: -     Lipid panel  Vitamin B 12 deficiency Assessment & Plan: Check Vitamin B 12 level  Orders: -     Vitamin B12  Vitamin D  deficiency Assessment & Plan: Low vitamin D  level in the past. Repeat vitamin D  level today. Lab ordered.  Currently on vitamin D  supplement, 10000 units daily.    Orders: -     VITAMIN D  25 Hydroxy (Vit-D Deficiency, Fractures)  Snoring Assessment & Plan: High suspicion for obstructive sleep apnea 9STOP-BANG 5). Previous home sleep study inconclusive.  Refer to sleep specialist for evaluation into snoring.   Orders: -     Pulmonary Visit  Elevated blood pressure reading Assessment & Plan: 10-year ASCVD risk 2.6%, BP goal <140/90 mmHg.  Emphasized lifestyle changes, DASH diet, exercise. Check BP three times a week, record readings. Bring home BP cuff to next appointment for calibration.    Atypical chest pain Assessment & Plan: If chest pain reoccurs, is a/w with symptoms like shortness of breath, nausea,  diaphoresis, recurrent, palpations recommend emergent evaluation.    Allergic conjunctivitis of both eyes Assessment & Plan: - Reports itchy eyes, runny nose. Zyrtec may have contributed to atypical chest pain. Avoid allergen.  Discussed alternatives: Pataday, nasal Flonase. Use Pataday eye drops daily. Use nasal Flonase two puffs each nostril for at least seven days when symptoms start. Avoid oral allergy medications, if symptoms persists despite above treatment try Fexofenadine 60 mg BID prn.    Need for shingles vaccine Assessment & Plan: - Recommend shingles vaccine, two doses with second dose 2-6 months after first. If you are interested in the shingles vaccine series (Shingrix), call your insurance or pharmacy to check on coverage and location it must be given.  If affordable - you can schedule it here or at your pharmacy depending on coverage.    I personally spent a total of 55 minutes in the care of the patient today including preparing to see the patient, performing a medically appropriate exam/evaluation, counseling and educating, placing orders, referring and communicating with other health care professionals, documenting clinical information in the EHR, independently interpreting results, and communicating results.  Return in about 3 months (around 02/21/2024) for BP follow up .   Luke Shade, MD

## 2023-11-22 NOTE — Assessment & Plan Note (Signed)
 Low vitamin D  level in the past. Repeat vitamin D  level today. Lab ordered.  Currently on vitamin D  supplement, 10000 units daily.

## 2023-11-22 NOTE — Assessment & Plan Note (Signed)
 If chest pain reoccurs, is a/w with symptoms like shortness of breath, nausea, diaphoresis, recurrent, palpations recommend emergent evaluation.

## 2023-11-22 NOTE — Assessment & Plan Note (Addendum)
 Elevated BP, low HDL, concern for OSA. Goal weight 215 pounds. Current weight 273 pounds.  Discussed extensive lifestyle modifications with: moderate intensity exercise daily for 30 min/at least 150 min per week, calorie deficit, healthy diet. Consider weight loss medication if lifestyle changes insufficient. No family h/o thyroid cancer. F/U in 3 months.

## 2023-11-22 NOTE — Assessment & Plan Note (Signed)
-   Recommend shingles vaccine, two doses with second dose 2-6 months after first. If you are interested in the shingles vaccine series (Shingrix), call your insurance or pharmacy to check on coverage and location it must be given.  If affordable - you can schedule it here or at your pharmacy depending on coverage.

## 2023-11-22 NOTE — Assessment & Plan Note (Signed)
 Check Vitamin B 12 level

## 2023-11-22 NOTE — Assessment & Plan Note (Signed)
-   Reports itchy eyes, runny nose. Zyrtec may have contributed to atypical chest pain. Avoid allergen.  Discussed alternatives: Pataday, nasal Flonase. Use Pataday eye drops daily. Use nasal Flonase two puffs each nostril for at least seven days when symptoms start. Avoid oral allergy medications, if symptoms persists despite above treatment try Fexofenadine 60 mg BID prn.

## 2023-11-22 NOTE — Assessment & Plan Note (Signed)
 Check A1c.

## 2023-11-22 NOTE — Assessment & Plan Note (Signed)
 10-year ASCVD risk 2.6%, BP goal <140/90 mmHg.  Emphasized lifestyle changes, DASH diet, exercise. Check BP three times a week, record readings. Bring home BP cuff to next appointment for calibration.

## 2023-11-22 NOTE — Assessment & Plan Note (Signed)
 High suspicion for obstructive sleep apnea 9STOP-BANG 5). Previous home sleep study inconclusive.  Refer to sleep specialist for evaluation into snoring.

## 2023-11-22 NOTE — Assessment & Plan Note (Signed)
 Previous labs: low HDL. Discussed diet and exercise for cholesterol management. Check cholesterol levels.

## 2023-11-22 NOTE — Patient Instructions (Addendum)
-   Use nasal flonase 2 puffs in each nostril for at least 7 days in a row when allergy symptoms starts.  - Pataday eye drops, use it daily for itchy eyes.  - Check BP 3 times a week, please write down home BP reading and bring it for a review in about 3 months. Please bring home BP cuff for calibration as well. - I am sending you to sleep doctor for evaluation into snoring.  - If you are interested in the shingles vaccine series (Shingrix), call your insurance or pharmacy to check on coverage and location it must be given.  If affordable - you can schedule it here or at your pharmacy depending on coverage.

## 2023-11-23 ENCOUNTER — Ambulatory Visit: Payer: Self-pay

## 2023-11-23 LAB — COMPREHENSIVE METABOLIC PANEL WITH GFR
ALT: 61 IU/L — ABNORMAL HIGH (ref 0–44)
AST: 35 IU/L (ref 0–40)
Albumin: 4.3 g/dL (ref 4.1–5.1)
Alkaline Phosphatase: 93 IU/L (ref 47–123)
BUN/Creatinine Ratio: 13 (ref 9–20)
BUN: 12 mg/dL (ref 6–24)
Bilirubin Total: 0.6 mg/dL (ref 0.0–1.2)
CO2: 23 mmol/L (ref 20–29)
Calcium: 9.2 mg/dL (ref 8.7–10.2)
Chloride: 101 mmol/L (ref 96–106)
Creatinine, Ser: 0.96 mg/dL (ref 0.76–1.27)
Globulin, Total: 2.3 g/dL (ref 1.5–4.5)
Glucose: 95 mg/dL (ref 70–99)
Potassium: 4.6 mmol/L (ref 3.5–5.2)
Sodium: 138 mmol/L (ref 134–144)
Total Protein: 6.6 g/dL (ref 6.0–8.5)
eGFR: 96 mL/min/1.73 (ref >59)

## 2023-11-23 LAB — VITAMIN B12: Vitamin B-12: 511 pg/mL (ref 232–1245)

## 2023-11-23 LAB — LIPID PANEL
Chol/HDL Ratio: 3.9 ratio (ref 0.0–5.0)
Cholesterol, Total: 129 mg/dL (ref 100–199)
HDL: 33 mg/dL — ABNORMAL LOW (ref >39)
LDL Chol Calc (NIH): 72 mg/dL (ref 0–99)
Triglycerides: 137 mg/dL (ref 0–149)
VLDL Cholesterol Cal: 24 mg/dL (ref 5–40)

## 2023-11-23 LAB — HEMOGLOBIN A1C
Est. average glucose Bld gHb Est-mCnc: 114 mg/dL
Hgb A1c MFr Bld: 5.6 % (ref 4.8–5.6)

## 2023-11-23 LAB — VITAMIN D 25 HYDROXY (VIT D DEFICIENCY, FRACTURES): Vit D, 25-Hydroxy: 38.6 ng/mL (ref 30.0–100.0)

## 2023-12-07 ENCOUNTER — Encounter: Payer: Self-pay | Admitting: Sleep Medicine

## 2023-12-07 ENCOUNTER — Ambulatory Visit: Admitting: Sleep Medicine

## 2023-12-07 VITALS — BP 124/80 | HR 79 | Temp 98.9°F | Ht 70.0 in | Wt 274.6 lb

## 2023-12-07 DIAGNOSIS — G4733 Obstructive sleep apnea (adult) (pediatric): Secondary | ICD-10-CM

## 2023-12-07 DIAGNOSIS — Z6839 Body mass index (BMI) 39.0-39.9, adult: Secondary | ICD-10-CM | POA: Diagnosis not present

## 2023-12-07 DIAGNOSIS — E669 Obesity, unspecified: Secondary | ICD-10-CM

## 2023-12-07 NOTE — Patient Instructions (Signed)
 Cameron Hood

## 2023-12-07 NOTE — Progress Notes (Signed)
 Name:Cameron Hood MRN: [id number] DOB: 1975/12/29   CHIEF COMPLAINT:  EXCESSIVE DAYTIME SLEEPINESS   HISTORY OF PRESENT ILLNESS: Cameron Hood is a 48 y.o. w/ a h/o obesity who presents for c/o loud snoring and excessive daytime sleepiness which has been present for several years. Reports nocturnal awakenings due to unclear reasons, however does not have difficulty falling back to sleep. Reports a 15-20 lb weight gain over the last year. Admits to dry mouth. Denies morning headaches, RLS symptoms, dream enactment, cataplexy, hypnagogic or hypnapompic hallucinations. Denies a family history of sleep apnea. Denies drowsy driving. Drinks 2 cups of coffee daily, occasional alcohol use, denies tobacco or illicit drug use.   Bedtime 10-11 pm Sleep onset 15-20 mins Rise time 6 am   EPWORTH SLEEP SCORE 2    12/07/2023    3:00 PM  Results of the Epworth flowsheet  Sitting and reading 0  Watching TV 1  Sitting, inactive in a public place (e.g. a theatre or a meeting) 0  As a passenger in a car for an hour without a break 0  Lying down to rest in the afternoon when circumstances permit 1  Sitting and talking to someone 0  Sitting quietly after a lunch without alcohol 0  In a car, while stopped for a few minutes in traffic 0  Total score 2    PAST MEDICAL HISTORY :   has a past medical history of Disseminated Lyme disease, Encounter for screening for HIV (12/13/2022), GERD (gastroesophageal reflux disease), History of Lyme disease (10/31/2014), Low testosterone , and Lumbago.  has a past surgical history that includes Ankle surgery (Right); Fusion of talonavicular joint (Left, 02/26/2017); Ankle fusion (Left, 02/26/2017); and Colonoscopy with propofol  (N/A, 12/31/2022). Prior to Admission medications   Medication Sig Start Date End Date Taking? Authorizing Provider  acetaminophen  (TYLENOL ) 500 MG tablet Take 1,000 mg by mouth every 6 (six) hours as needed for moderate pain or headache.    Yes [provider]  calcipotriene  (DOVONOX) 0.005 % cream Apply to legs once a day 08/09/23  Yes Cameron Alm BROCKS, MD  cholecalciferol (VITAMIN D3) 25 MCG (1000 UNIT) tablet Take 10,000 Units by mouth daily.   Yes [provider]  MEGARED OMEGA-3 KRILL OIL 500 MG CAPS Take 1 capsule by mouth daily. 12/04/15  Yes Hood, Krichna, MD  Multiple Vitamins-Minerals (MULTIVITAMIN WITH MINERALS) tablet Take 1 tablet by mouth daily.   Yes [provider]  naproxen sodium (ALEVE) 220 MG tablet Take 220 mg by mouth 2 (two) times daily as needed (for pain or headache).   Yes [provider]  Roflumilast  (ZORYVE ) 0.3 % CREA Apply 1 application  topically daily. 08/17/23  Yes Cameron Sawyer, MD  Testosterone  1.62 % GEL Place 6 Pump onto the skin daily. 09/02/23  Yes Cameron Redell BROCKS, MD   Allergies  Allergen Reactions   Mobic  [Meloxicam ] Other (See Comments)    Heart race    FAMILY HISTORY:  family history includes Anxiety disorder in his mother; Cancer in his mother and paternal grandmother; Depression in his paternal grandmother; Diabetes in his father; Early death in his maternal grandfather and paternal grandfather; Hypertension in his father. SOCIAL HISTORY:  reports that he has never smoked. He has never used smokeless tobacco. He reports that he does not currently use alcohol. He reports that he does not use drugs.   Review of Systems:  Gen:  Denies  fever, sweats, chills weight loss  HEENT: Denies blurred  vision, double vision, ear pain, eye pain, hearing loss, nose bleeds, sore throat Cardiac:  No dizziness, chest pain or heaviness, chest tightness,edema, No JVD Resp:   No cough, -sputum production, -shortness of breath,-wheezing, -hemoptysis,  Gi: Denies swallowing difficulty, stomach pain, nausea or vomiting, diarrhea, constipation, bowel incontinence Gu:  Denies bladder incontinence, burning urine Ext:   Denies Joint pain, stiffness or swelling Skin:  Denies  skin rash, easy bruising or bleeding or hives Endoc:  Denies polyuria, polydipsia , polyphagia or weight change Psych:   Denies depression, insomnia or hallucinations  Other:  All other systems negative  VITAL SIGNS: BP (!) 134/90   Pulse 79   Temp 98.9 F (37.2 C)   Ht 5' 10 (1.778 m)   Wt 274 lb 9.6 oz (124.6 kg)   SpO2 97%   BMI 39.40 kg/m    Physical Examination:   General Appearance: No distress  EYES PERRLA, EOM intact.   NECK Supple, No JVD Pulmonary: normal breath sounds, No wheezing.  CardiovascularNormal S1,S2.  No m/r/g.   Abdomen: Benign, Soft, non-tender. Skin:   warm, no rashes, no ecchymosis  Extremities: normal, no cyanosis, clubbing. Neuro:without focal findings,  speech normal  PSYCHIATRIC: Mood, affect within normal limits.   ASSESSMENT AND PLAN  OSA I suspect that OSA is likely present due to clinical presentation. Discussed the consequences of untreated sleep apnea. Advised not to drive drowsy for safety of patient and others. Will complete further evaluation with a home sleep study and follow up to review results.    Obesity Counseled patient on diet and lifestyle modification.     MEDICATION ADJUSTMENTS/LABS AND TESTS ORDERED: Recommend Sleep Study   Patient  satisfied with Plan of action and management. All questions answered  Follow up to review HST results and treatment plan.   I spent a total of 33 minutes reviewing chart data, face-to-face evaluation with the patient, counseling and coordination of care as detailed above.    Cameron Hood, M.D.  Sleep Medicine [facility]

## 2023-12-14 ENCOUNTER — Encounter

## 2023-12-14 DIAGNOSIS — G4733 Obstructive sleep apnea (adult) (pediatric): Secondary | ICD-10-CM

## 2023-12-27 ENCOUNTER — Encounter: Payer: Self-pay | Admitting: Sleep Medicine

## 2023-12-27 NOTE — Telephone Encounter (Signed)
 Can you see if [employer] has his results?

## 2023-12-27 NOTE — Telephone Encounter (Signed)
 The [employer] Website states the study is being analyzed

## 2023-12-28 DIAGNOSIS — G4733 Obstructive sleep apnea (adult) (pediatric): Secondary | ICD-10-CM | POA: Diagnosis not present

## 2023-12-29 ENCOUNTER — Ambulatory Visit: Payer: Self-pay

## 2023-12-29 DIAGNOSIS — G4733 Obstructive sleep apnea (adult) (pediatric): Secondary | ICD-10-CM

## 2023-12-30 ENCOUNTER — Encounter: Payer: Self-pay | Admitting: Urology

## 2023-12-30 ENCOUNTER — Other Ambulatory Visit: Payer: Self-pay | Admitting: Medical Genetics

## 2023-12-30 DIAGNOSIS — Z006 Encounter for examination for normal comparison and control in clinical research program: Secondary | ICD-10-CM

## 2024-01-06 NOTE — Telephone Encounter (Signed)
 I don't know what all Dr. Jess can do to program a machine. I do know that like Zea stated if something breaks there is no one you can carry the machine to to get it fixed. We can send an order to requested place if that is whay you want

## 2024-01-20 NOTE — Telephone Encounter (Signed)
 Decreased max pressure to 8 per Dr. Chase request.

## 2024-02-01 ENCOUNTER — Ambulatory Visit: Admitting: Urology

## 2024-02-02 ENCOUNTER — Ambulatory Visit: Payer: Self-pay | Admitting: Urology

## 2024-02-07 NOTE — Telephone Encounter (Signed)
 Sticky note on provider's desk.

## 2024-02-07 NOTE — Addendum Note (Signed)
 Addended by: Gidget Quizhpi J on: 02/07/2024 04:51 PM   Modules accepted: Orders

## 2024-02-08 ENCOUNTER — Encounter: Payer: Self-pay | Admitting: Dermatology

## 2024-02-10 ENCOUNTER — Other Ambulatory Visit: Payer: Self-pay

## 2024-02-10 DIAGNOSIS — E291 Testicular hypofunction: Secondary | ICD-10-CM

## 2024-02-11 ENCOUNTER — Other Ambulatory Visit

## 2024-02-11 DIAGNOSIS — E291 Testicular hypofunction: Secondary | ICD-10-CM

## 2024-02-12 LAB — HEMOGLOBIN AND HEMATOCRIT, BLOOD
Hematocrit: 50.9 % (ref 37.5–51.0)
Hemoglobin: 16.6 g/dL (ref 13.0–17.7)

## 2024-02-12 LAB — PSA: Prostate Specific Ag, Serum: 1 ng/mL (ref 0.0–4.0)

## 2024-02-12 LAB — TESTOSTERONE: Testosterone: 236 ng/dL — ABNORMAL LOW (ref 264–916)

## 2024-02-15 ENCOUNTER — Ambulatory Visit: Admitting: Urology

## 2024-02-15 VITALS — BP 156/81 | HR 76 | Ht 70.0 in | Wt 257.0 lb

## 2024-02-15 DIAGNOSIS — E291 Testicular hypofunction: Secondary | ICD-10-CM

## 2024-02-15 DIAGNOSIS — Z125 Encounter for screening for malignant neoplasm of prostate: Secondary | ICD-10-CM

## 2024-02-15 MED ORDER — TESTOSTERONE 1.62 % TD GEL: 6.0000 | Freq: Every day | TRANSDERMAL | 5 refills | Status: AC

## 2024-02-15 NOTE — Patient Instructions (Signed)
 Hypogonadism, Male  Male hypogonadism is a condition of having a level of testosterone  that is lower than normal. Testosterone  is a chemical, or hormone, that is made mainly in the testicles. In boys, testosterone  is responsible for the development of male characteristics during puberty. These include: Making the penis bigger. Growing and building the muscles. Growing facial hair. Deepening the voice. In adult men, testosterone  is responsible for maintaining: An interest in sex and the ability to have sex. Muscle mass. Sperm production. Red blood cell production. Bone strength. Testosterone  also gives men energy and a sense of well-being. Testosterone  normally decreases as men age and the testicles make less testosterone . Testosterone  levels can vary from man to man. Not all men will have signs and symptoms of low testosterone . Weight, alcohol use, medicines, and certain medical conditions can affect a man's testosterone  level. What are the causes? This condition is caused by: A natural decrease in testosterone  that occurs as a man grows older. This is the main cause of this condition. Use of medicines, such as antidepressants, steroids, and opioids. Diseases and conditions that affect the testicles or the making of testosterone . These include: Injury or damage to the testicles from trauma, cancer, cancer treatment, or infection. Diabetes. Sleep apnea. Genetic conditions that men are born with. Disease of the pituitary gland. This gland is in the brain. It produces hormones. Obesity. Metabolic syndrome. This is a group of diseases that affect blood pressure, blood sugar, cholesterol, and belly fat. HIV or AIDS. Alcohol abuse. Kidney failure. Other long-term or chronic diseases. What are the signs or symptoms? Common symptoms of this condition include: Loss of interest in sex (low sex drive). Inability to have or maintain an erection (erectile dysfunction). Feeling tired  (fatigue). Mood changes, like irritability or depression. Loss of muscle and body hair. Infertility. Large breasts. Weight gain (obesity). How is this diagnosed? Your health care provider can diagnose hypogonadism based on: Your signs and symptoms. A physical exam to check your testosterone  levels. This includes blood tests. Testosterone  levels can change throughout the day. Levels are highest in the morning. You may need to have repeat blood tests before getting a diagnosis of hypogonadism. Depending on your medical history and test results, your health care provider may also do other tests to find the cause of low testosterone . How is this treated? This condition is treated with testosterone  replacement therapy. Testosterone  can be given by: Injection or through pellets inserted under the skin. Gels or patches placed on the skin or in the mouth. Testosterone  therapy is not for everyone. It has risks and side effects. Your health care provider will consider your medical history, your risk for prostate cancer, your age, and your symptoms before putting you on testosterone  replacement therapy. Follow these instructions at home: Take over-the-counter and prescription medicines only as told by your health care provider. Eat foods that are high in fiber, such as beans, whole grains, and fresh fruits and vegetables. Limit foods that are high in fat and processed sugars, such as fried or sweet foods. If you drink alcohol: Limit how much you have to 0-2 drinks a day. Know how much alcohol is in your drink. In the [place] one drink equals one 12 oz bottle of beer (355 mL), one 5 oz glass of wine (148 mL), or one 1 oz glass of hard liquor (44 mL). Return to your normal activities as told by your health care provider. Ask your health care provider what activities are safe for you. Keep all  follow-up visits. This is important. Contact a health care provider if: You have any of the signs or symptoms of  low testosterone . You have any side effects from testosterone  therapy. Summary Male hypogonadism is a condition of having a level of testosterone  that is lower than normal. The natural drop in testosterone  production that occurs with age is the most common cause of this condition. Low testosterone  can also be caused by many diseases and conditions that affect the testicles and the making of testosterone . This condition is treated with testosterone  replacement therapy. There are risks and side effects of testosterone  therapy. Your health care provider will consider your age, medical history, symptoms, and risks for prostate cancer before putting you on testosterone  therapy. This information is not intended to replace advice given to you by your health care provider. Make sure you discuss any questions you have with your health care provider. Document Revised: 02/01/2023 Document Reviewed: 02/01/2023 Elsevier Patient Education  2025 [employer].

## 2024-02-15 NOTE — Progress Notes (Signed)
   02/15/2024 3:04 PM   Cameron Hood 03-May-1975 [id number]  Reason for visit: Follow up hypogonadism, PSA screening  History: Has been on testosterone  replacement 10+ years, initially started by PCP, was referred to urology November 2024 his PCP was no longer comfortable filling testosterone  Using 6 pumps per day of generic testosterone  1.62% gel Initial hypogonadism symptoms were low energy, mood swings/depression PSA has been low  Physical Exam: BP (!) 156/81 (BP Location: Left Arm, Patient Position: Sitting, Cuff Size: Large)   Pulse 76   Ht 5' 10 (1.778 m)   Wt 257 lb (116.6 kg)   SpO2 96%   BMI 36.88 kg/m    Imaging/labs: 02/11/2024: Testosterone  236, hematocrit 50.9, PSA 1.0  Today: Overall doing well, does report some decreased energy that correlates with recent low testosterone  value.  Has not missed any doses.  Has a new infant in the house and sleep has been interrupted, diagnosed with sleep apnea but noncompliant with CPAP  Plan:   Hypogonadism: Refilled, will recheck testosterone  levels in 2 to 3 weeks, may need to consider alternative replacement options if remains below 300, has also had some concerns about insurance not filling his prior testosterone  dose.  Consider follow-up with [facility] to discuss alternatives PSA screening: Reassurance provided regarding low value, continue yearly monitoring on testosterone  replacement Lab visit testosterone  2 weeks, call with results   Cameron JAYSON Burnet, MD  [facility] [address] [place] [address], [address] [place], [place] [address] [phone]

## 2024-02-22 ENCOUNTER — Ambulatory Visit

## 2024-02-22 VITALS — BP 130/80 | HR 65 | Temp 98.2°F | Ht 70.0 in | Wt 268.6 lb

## 2024-02-22 DIAGNOSIS — E66812 Obesity, class 2: Secondary | ICD-10-CM

## 2024-02-22 DIAGNOSIS — G4733 Obstructive sleep apnea (adult) (pediatric): Secondary | ICD-10-CM | POA: Insufficient documentation

## 2024-02-22 DIAGNOSIS — I10 Essential (primary) hypertension: Secondary | ICD-10-CM | POA: Insufficient documentation

## 2024-02-22 DIAGNOSIS — Z6837 Body mass index (BMI) 37.0-37.9, adult: Secondary | ICD-10-CM

## 2024-02-22 DIAGNOSIS — R748 Abnormal levels of other serum enzymes: Secondary | ICD-10-CM | POA: Insufficient documentation

## 2024-02-22 MED ORDER — TELMISARTAN 20 MG PO TABS: 20.0000 mg | ORAL_TABLET | Freq: Every day | ORAL | 1 refills | Status: AC

## 2024-02-22 NOTE — Patient Instructions (Signed)
 Start Telmisartan  20 mg daily. Keep blood pressure record, follow up in 2 months for BP check.  Increase daily water  intake to about 60 oz  Keep on working at [employer].

## 2024-02-22 NOTE — Assessment & Plan Note (Addendum)
 Since working on lifestyle including diet, exercise he has lost weight. Continued weight loss essential for health improvement. Encouraged him to continue to cut down on carbohydrate, sugar rich food.

## 2024-02-22 NOTE — Assessment & Plan Note (Signed)
 Difficulty with CPAP due to high pressure settings, inadequate usage, potential headache contribution. Insurance may require increased usage. Recommend patient reaches out to sleep clinic to see if CPAP pressure, mask could be adjusted.

## 2024-02-22 NOTE — Assessment & Plan Note (Signed)
 Home blood pressure consistently >130/80 mmHg. Lifestyle changes insufficient. Start telmisartan  20 mg daily. Monitor blood pressure 2-3 times a week, report dizziness, weakness, or increased headaches.  Follow-up in 2 months for blood pressure control and kidney function. Orders:   telmisartan  (MICARDIS ) 20 MG tablet; Take 1 tablet (20 mg total) by mouth daily.   Comp Met (CMET); Future

## 2024-02-22 NOTE — Progress Notes (Signed)
 Established Patient Office Visit   Subjective  Patient ID: Cameron Hood, male    DOB: [birth year]  Age: 48 y.o. MRN: [id number]  Chief Complaint  Patient presents with   Hypertension    Discussed the use of AI scribe software for clinical note transcription with the patient, who gave verbal consent to proceed.  History of Present Illness Cameron Hood is a 48 year old male presenting for follow up on elevated blood pressure readings, elevated ALT.   - Since his last visit, he has been diagnoses with OSA. He experiences discomfort with the CPAP mask and pressure settings, leading to frequent awakenings at night. The pressure feels too high when awake, prompting him to turn off the machine and restart it to lower the pressure. He struggles to use the mask for more than two to three hours, with a maximum of four and a half hours on rare occasions. His average usage this week is three hours and seven minutes, and last week it was two hours and fifty-two minutes.  - Elevated blood pressure reading at home and above goal BP reading during recent urology visit. Home BP ranging: SBPs in 140s and DBP 80-90 mmHg. Previously treated with Propanolol many years ago. He experiences occasional headaches, primarily in the evenings after work. He takes Excedrin for relief and recalls a period when he took naproxen, which coincided with a severe headache, leading him to discontinue its use. He has had one or two less severe headaches since then.   - Elevated ALT during his last visit, obesity:  He has been working on weight loss and reports a recent weight of around 256-257 pounds, down from a previous high of 270 pounds. He attributes some of his weight loss to dietary changes, including eating more salads and lean meats.  He takes several supplements, including vitamin D3 10000 units daily, a garlic supplement, and a multivitamin, but only takes half the recommended dose of the garlic supplement. He  rarely consumes alcohol and tries to maintain a healthy diet, although he occasionally drinks regular soda and consumes mints throughout the day.  - Hypogonadism: He is on testosterone  therapy, which he has been using since 2012. He follows up with urology for hypogonadism management. Recent Hemoglobin, hematocrit has been normal.      ROS As per HPI    Objective:     BP 130/80 (BP Location: Right Arm, Patient Position: Sitting, Cuff Size: Normal)   Pulse 65   Temp 98.2 F (36.8 C) (Oral)   Ht 5' 10 (1.778 m)   Wt 268 lb 9.6 oz (121.8 kg)   SpO2 96%   BMI 38.54 kg/m      02/22/2024    8:15 AM 11/22/2023    9:03 AM 12/04/2022    9:05 AM  Depression screen PHQ 2/9  Decreased Interest 0 0 0  Down, Depressed, Hopeless 0 0 0  PHQ - 2 Score 0 0 0  Altered sleeping 1 1 1   Tired, decreased energy 1 0 1  Change in appetite 0 0 0  Feeling bad or failure about yourself  0 0 0  Trouble concentrating 0 0 0  Moving slowly or fidgety/restless 0 0 0  Suicidal thoughts 0 0 0  PHQ-9 Score 2 1  2    Difficult doing work/chores Not difficult at all Not difficult at all Not difficult at all     Data saved with a previous flowsheet row definition  02/22/2024    8:15 AM 11/22/2023    9:03 AM 12/04/2022    9:05 AM  GAD 7 : Generalized Anxiety Score  Nervous, Anxious, on Edge 0 0 0  Control/stop worrying 0 0 0  Worry too much - different things 0 0 0  Trouble relaxing 0 0 0  Restless 0 0 0  Easily annoyed or irritable 0 0 0  Afraid - awful might happen 0 1 0  Total GAD 7 Score 0 1 0  Anxiety Difficulty Not difficult at all Not difficult at all Not difficult at all      02/22/2024    8:15 AM 11/22/2023    9:03 AM 12/04/2022    9:05 AM  Depression screen PHQ 2/9  Decreased Interest 0 0 0  Down, Depressed, Hopeless 0 0 0  PHQ - 2 Score 0 0 0  Altered sleeping 1 1 1   Tired, decreased energy 1 0 1  Change in appetite 0 0 0  Feeling bad or failure about yourself  0 0 0   Trouble concentrating 0 0 0  Moving slowly or fidgety/restless 0 0 0  Suicidal thoughts 0 0 0  PHQ-9 Score 2 1  2    Difficult doing work/chores Not difficult at all Not difficult at all Not difficult at all     Data saved with a previous flowsheet row definition      02/22/2024    8:15 AM 11/22/2023    9:03 AM 12/04/2022    9:05 AM  GAD 7 : Generalized Anxiety Score  Nervous, Anxious, on Edge 0 0 0  Control/stop worrying 0 0 0  Worry too much - different things 0 0 0  Trouble relaxing 0 0 0  Restless 0 0 0  Easily annoyed or irritable 0 0 0  Afraid - awful might happen 0 1 0  Total GAD 7 Score 0 1 0  Anxiety Difficulty Not difficult at all Not difficult at all Not difficult at all   SDOH Screenings   Food Insecurity: No Food Insecurity (11/22/2023)  Housing: Unknown (11/22/2023)  Transportation Needs: No Transportation Needs (11/22/2023)  Alcohol Screen: Low Risk (11/22/2023)  Depression (PHQ2-9): Low Risk (02/22/2024)  Financial Resource Strain: Low Risk (11/22/2023)  Physical Activity: Inactive (11/22/2023)  Social Connections: Socially Isolated (11/22/2023)  Stress: No Stress Concern Present (11/22/2023)  Tobacco Use: Low Risk (02/22/2024)     Physical Exam Constitutional:      General: He is not in acute distress.    Appearance: Normal appearance. He is obese.  HENT:     Head: Normocephalic and atraumatic.     Right Ear: Tympanic membrane and external ear normal.     Left Ear: Tympanic membrane and external ear normal.     Mouth/Throat:     Mouth: Mucous membranes are moist.  Neck:     Thyroid: No thyroid mass or thyroid tenderness.  Cardiovascular:     Rate and Rhythm: Normal rate and regular rhythm.  Pulmonary:     Effort: Pulmonary effort is normal.     Breath sounds: Normal breath sounds. No wheezing.  Abdominal:     General: Abdomen is protuberant. Bowel sounds are normal.     Palpations: Abdomen is soft.     Tenderness: There is no abdominal tenderness.  There is no guarding.  Musculoskeletal:     Cervical back: Neck supple. No rigidity or tenderness.     Right lower leg: No edema.     Left lower leg:  No edema.  Skin:    General: Skin is warm.  Neurological:     Mental Status: He is alert and oriented to person, place, and time.  Psychiatric:        Mood and Affect: Mood normal.        Behavior: Behavior normal.        No results found for any visits on 02/22/24.  The ASCVD Risk score (Arnett DK, et al., 2019) failed to calculate for the following reasons:   The valid total cholesterol range is 130 to 320 mg/dL     Assessment & Plan:   Assessment & Plan Primary hypertension Home blood pressure consistently >130/80 mmHg. Lifestyle changes insufficient. Start telmisartan  20 mg daily. Monitor blood pressure 2-3 times a week, report dizziness, weakness, or increased headaches.  Follow-up in 2 months for blood pressure control and kidney function. Orders:   telmisartan  (MICARDIS ) 20 MG tablet; Take 1 tablet (20 mg total) by mouth daily.   Comp Met (CMET); Future  Class 2 obesity without serious comorbidity with body mass index (BMI) of 37.0 to 37.9 in adult, unspecified obesity type Since working on lifestyle including diet, exercise he has lost weight. Continued weight loss essential for health improvement. Encouraged him to continue to cut down on carbohydrate, sugar rich food.     Abnormal liver enzymes Check CBP, CMP to calculate FIB4.   Consider liver ultrasound if enzymes remain elevated. Continue weight loss and dietary modifications.  Orders:   CBC w/Diff   Comp Met (CMET)  OSA (obstructive sleep apnea) Difficulty with CPAP due to high pressure settings, inadequate usage, potential headache contribution. Insurance may require increased usage. Recommend patient reaches out to sleep clinic to see if CPAP pressure, mask could be adjusted.      Return in about 2 months (around 04/24/2024) for HTN follow up .    Luke Shade, MD

## 2024-02-22 NOTE — Assessment & Plan Note (Addendum)
 Check CBP, CMP to calculate FIB4.   Consider liver ultrasound if enzymes remain elevated. Continue weight loss and dietary modifications.  Orders:   CBC w/Diff   Comp Met (CMET)

## 2024-02-23 ENCOUNTER — Ambulatory Visit: Payer: Self-pay

## 2024-02-23 DIAGNOSIS — R748 Abnormal levels of other serum enzymes: Secondary | ICD-10-CM

## 2024-02-23 LAB — CBC WITH DIFFERENTIAL/PLATELET
Basophils Absolute: 0.1 x10E3/uL (ref 0.0–0.2)
Basos: 1 %
EOS (ABSOLUTE): 0.2 x10E3/uL (ref 0.0–0.4)
Eos: 4 %
Hematocrit: 48.2 % (ref 37.5–51.0)
Hemoglobin: 15.9 g/dL (ref 13.0–17.7)
Immature Grans (Abs): 0.1 x10E3/uL (ref 0.0–0.1)
Immature Granulocytes: 1 %
Lymphocytes Absolute: 1.1 x10E3/uL (ref 0.7–3.1)
Lymphs: 21 %
MCH: 28.2 pg (ref 26.6–33.0)
MCHC: 33 g/dL (ref 31.5–35.7)
MCV: 86 fL (ref 79–97)
Monocytes Absolute: 0.5 x10E3/uL (ref 0.1–0.9)
Monocytes: 9 %
Neutrophils Absolute: 3.5 x10E3/uL (ref 1.4–7.0)
Neutrophils: 64 %
Platelets: 302 x10E3/uL (ref 150–450)
RBC: 5.63 x10E6/uL (ref 4.14–5.80)
RDW: 13.2 % (ref 11.6–15.4)
WBC: 5.3 x10E3/uL (ref 3.4–10.8)

## 2024-02-23 LAB — COMPREHENSIVE METABOLIC PANEL WITH GFR
ALT: 46 IU/L — ABNORMAL HIGH (ref 0–44)
AST: 28 IU/L (ref 0–40)
Albumin: 4.1 g/dL (ref 4.1–5.1)
Alkaline Phosphatase: 95 IU/L (ref 47–123)
BUN/Creatinine Ratio: 12 (ref 9–20)
BUN: 13 mg/dL (ref 6–24)
Bilirubin Total: 0.6 mg/dL (ref 0.0–1.2)
CO2: 23 mmol/L (ref 20–29)
Calcium: 9.3 mg/dL (ref 8.7–10.2)
Chloride: 104 mmol/L (ref 96–106)
Creatinine, Ser: 1.05 mg/dL (ref 0.76–1.27)
Globulin, Total: 2.5 g/dL (ref 1.5–4.5)
Glucose: 101 mg/dL — ABNORMAL HIGH (ref 70–99)
Potassium: 4.1 mmol/L (ref 3.5–5.2)
Sodium: 140 mmol/L (ref 134–144)
Total Protein: 6.6 g/dL (ref 6.0–8.5)
eGFR: 86 mL/min/1.73 (ref >59)

## 2024-03-06 ENCOUNTER — Other Ambulatory Visit

## 2024-03-06 ENCOUNTER — Telehealth: Payer: Self-pay

## 2024-03-06 DIAGNOSIS — E291 Testicular hypofunction: Secondary | ICD-10-CM

## 2024-03-06 NOTE — Telephone Encounter (Signed)
 Prior auth for Testosterone  Gel 1.62% submitted via Covermymeds. Awaiting response.

## 2024-03-07 ENCOUNTER — Ambulatory Visit: Payer: Self-pay | Admitting: Urology

## 2024-03-07 DIAGNOSIS — E291 Testicular hypofunction: Secondary | ICD-10-CM

## 2024-03-07 LAB — TESTOSTERONE: Testosterone: 264 ng/dL (ref 264–916)

## 2024-03-07 NOTE — Telephone Encounter (Signed)
 Lab appt scheduled. Testosterone  ordered.

## 2024-03-20 ENCOUNTER — Encounter: Payer: Self-pay | Admitting: Dermatology

## 2024-03-20 ENCOUNTER — Ambulatory Visit: Admitting: Dermatology

## 2024-03-20 DIAGNOSIS — L409 Psoriasis, unspecified: Secondary | ICD-10-CM

## 2024-03-20 DIAGNOSIS — Z7189 Other specified counseling: Secondary | ICD-10-CM

## 2024-03-20 DIAGNOSIS — B353 Tinea pedis: Secondary | ICD-10-CM | POA: Diagnosis not present

## 2024-03-20 DIAGNOSIS — B356 Tinea cruris: Secondary | ICD-10-CM

## 2024-03-20 DIAGNOSIS — Z79899 Other long term (current) drug therapy: Secondary | ICD-10-CM

## 2024-03-20 MED ORDER — CALCIPOTRIENE 0.005 % EX CREA: TOPICAL_CREAM | CUTANEOUS | 6 refills | Status: AC

## 2024-03-20 MED ORDER — KETOCONAZOLE 2 % EX CREA: TOPICAL_CREAM | CUTANEOUS | 6 refills | Status: AC

## 2024-03-20 MED ORDER — ZORYVE 0.3 % EX CREA: TOPICAL_CREAM | CUTANEOUS | 5 refills | Status: AC

## 2024-03-20 MED ORDER — TERBINAFINE HCL 250 MG PO TABS: 250.0000 mg | ORAL_TABLET | Freq: Every day | ORAL | 0 refills | Status: AC

## 2024-03-20 NOTE — Progress Notes (Signed)
 "  Follow-Up Visit   Subjective  Cameron Hood is a 49 y.o. male who presents for the following: psoriasis follow up A small active area behind left leg but using creams  Zorvye cream and calcipotriene  cream  Reports some possible jock itch since July has been using otc lotrim cream   The following portions of the chart were reviewed this encounter and updated as appropriate: medications, allergies, medical history  Review of Systems:  No other skin or systemic complaints except as noted in HPI or Assessment and Plan.  Objective  Well appearing patient in no apparent distress; mood and affect are within normal limits.  A focused examination was performed of the following areas: Groin, legs, and feet   Relevant exam findings are noted in the Assessment and Plan.    Assessment & Plan   PSORIASIS Improved on topical Zoryve  and Calcipotriene   Less itchy Left leg, right leg  Well-demarcated erythematous papules/plaques with silvery scale, guttate pink scaly papules. 9% BSA. Chronic and persistent condition with duration or expected duration over one year. Condition is improving with treatment but not currently at goal. Treatment Plan: Continue Zoryve  cream qd-bid  Continue Calcipotriene  cream apply to legs qd-bid    Have discussed Otezla or other systemic treatment in future if bothersome.    Counseling on psoriasis and coordination of care  psoriasis is a chronic non-curable, but treatable genetic/hereditary disease that may have other systemic features affecting other organ systems such as joints (Psoriatic Arthritis). It is associated with an increased risk of inflammatory bowel disease, heart disease, non-alcoholic fatty liver disease, and depression.  Treatments include light and laser treatments; topical medications; and systemic medications including oral and injectables.    Tinea Cruris at Groin / Tinea Pedis at feet  Exam: Annulare patch at groin And scaling and  maceration web spaces and over distal and lateral soles.  Chronic and persistent condition with duration or expected duration over one year. Condition is bothersome/symptomatic for patient. Currently flared. Reviewed recent CMP from 02/22/2024 Liver function test were slight elevated but ok  Treatment Plan: Discussed Terbinafine  Counseling  Start Terbinafine  250 mg cap - take 1 capsule by mouth daily for 1 month  Start Ketoconazole  2 % cream apply topically to feet and groin nightly for 2 months   Terbinafine  is an anti-fungal medicine that can be applied to the skin (over the counter) or taken by mouth (prescription) to treat fungal infections. The pill version is often used to treat fungal infections of the nails or scalp. While most people do not have any side effects from taking terbinafine  pills, some possible side effects of the medicine can include taste changes, headache, loss of smell, vision changes, nausea, vomiting, or diarrhea.   Rare side effects can include irritation of the liver, allergic reaction, or decrease in blood counts (which may show up as not feeling well or developing an infection). If you are concerned about any of these side effects, please stop the medicine and call your doctor, or in the case of an emergency such as feeling very unwell, seek immediate medical care.  TINEA PEDIS OF BOTH FEET   This Visit - ketoconazole  (NIZORAL ) 2 % cream - Apply topically to feet and groin for fungus at bedtime - terbinafine  (LAMISIL ) 250 MG tablet - Take 1 tablet (250 mg total) by mouth daily. TINEA CRURIS   This Visit - ketoconazole  (NIZORAL ) 2 % cream - Apply topically to feet and groin for fungus at bedtime -  terbinafine  (LAMISIL ) 250 MG tablet - Take 1 tablet (250 mg total) by mouth daily. PSORIASIS   This Visit - Roflumilast  (ZORYVE ) 0.3 % CREA - Apply 2 grams twice daily to affected areas of skin for psoriasis - calcipotriene  (DOVONOX) 0.005 % cream - Apply to legs  once a day for psoriasis Existing Treatments - Roflumilast  (ZORYVE ) 0.3 % CREA - Apply 1 application  topically daily.  Return in about 6 months (around 09/17/2024) for psoriasis /  tinea pedis / tinea cruris .  IEleanor Blush, CMA, am acting as scribe for Alm Rhyme, MD.   Documentation: I have reviewed the above documentation for accuracy and completeness, and I agree with the above.  Alm Rhyme, MD    "

## 2024-03-20 NOTE — Patient Instructions (Signed)
 For rash at groin and feet  Start terbinafine  250 mg tab take 1 by mouth daily for 1 month  Terbinafine  Counseling  Terbinafine  is an anti-fungal medicine that can be applied to the skin (over the counter) or taken by mouth (prescription) to treat fungal infections. The pill version is often used to treat fungal infections of the nails or scalp. While most people do not have any side effects from taking terbinafine  pills, some possible side effects of the medicine can include taste changes, headache, loss of smell, vision changes, nausea, vomiting, or diarrhea.   Rare side effects can include irritation of the liver, allergic reaction, or decrease in blood counts (which may show up as not feeling well or developing an infection). If you are concerned about any of these side effects, please stop the medicine and call your doctor, or in the case of an emergency such as feeling very unwell, seek immediate medical care.   Start ketoconazole  cream apply nightly to feet and groin for 2 months      Due to recent changes in healthcare laws, you may see results of your pathology and/or laboratory studies on MyChart before the doctors have had a chance to review them. We understand that in some cases there may be results that are confusing or concerning to you. Please understand that not all results are received at the same time and often the doctors may need to interpret multiple results in order to provide you with the best plan of care or course of treatment. Therefore, we ask that you please give us  2 business days to thoroughly review all your results before contacting the office for clarification. Should we see a critical lab result, you will be contacted sooner.   If You Need Anything After Your Visit  If you have any questions or concerns for your doctor, please call our main line at [phone] and press option 4 to reach your doctor's medical assistant. If no one answers, please leave a  voicemail as directed and we will return your call as soon as possible. Messages left after 4 pm will be answered the following business day.   You may also send us  a message via MyChart. We typically respond to MyChart messages within 1-2 business days.  For prescription refills, please ask your pharmacy to contact our office. Our fax number is [phone].  If you have an urgent issue when the clinic is closed that cannot wait until the next business day, you can page your doctor at the number below.    Please note that while we do our best to be available for urgent issues outside of office hours, we are not available 24/7.   If you have an urgent issue and are unable to reach us , you may choose to seek medical care at your doctor's office, retail clinic, urgent care center, or emergency room.  If you have a medical emergency, please immediately call 911 or go to the emergency department.  Pager Numbers  - Dr. Hester: [phone]  - Dr. Jackquline: [phone]  - Dr. Claudene: [phone]   - Dr. Raymund: [phone]  In the event of inclement weather, please call our main line at [phone] for an update on the status of any delays or closures.  Dermatology Medication Tips: Please keep the boxes that topical medications come in in order to help keep track of the instructions about where and how to use these. Pharmacies typically print the medication instructions only on the boxes and  not directly on the medication tubes.   If your medication is too expensive, please contact our office at [phone] option 4 or send us  a message through MyChart.   We are unable to tell what your co-pay for medications will be in advance as this is different depending on your insurance coverage. However, we may be able to find a substitute medication at lower cost or fill out paperwork to get insurance to cover a needed medication.   If a prior authorization is required to get your medication  covered by your insurance company, please allow us  1-2 business days to complete this process.  Drug prices often vary depending on where the prescription is filled and some pharmacies may offer cheaper prices.  The website www.goodrx.com contains coupons for medications through different pharmacies. The prices here do not account for what the cost may be with help from insurance (it may be cheaper with your insurance), but the website can give you the price if you did not use any insurance.  - You can print the associated coupon and take it with your prescription to the pharmacy.  - You may also stop by our office during regular business hours and pick up a GoodRx coupon card.  - If you need your prescription sent electronically to a different pharmacy, notify our office through [facility] or by phone at [phone] option 4.     Si Usted Necesita Algo Despus de Su Visita  Tambin puede enviarnos un mensaje a travs de Clinical Cytogeneticist. Por lo general respondemos a los mensajes de MyChart en el transcurso de 1 a 2 das hbiles.  Para renovar recetas, por favor pida a su farmacia que se ponga en contacto con nuestra oficina. Randi lakes de fax es [place] [phone].  Si tiene un asunto urgente cuando la clnica est cerrada y que no puede esperar hasta el siguiente da hbil, puede llamar/localizar a su doctor(a) al nmero que aparece a continuacin.   Por favor, tenga en cuenta que aunque hacemos todo lo posible para estar disponibles para asuntos urgentes fuera del horario de [place], no estamos disponibles las 24 horas del da, los 7 [address]  [address] de la [place].   Si tiene un problema urgente y no puede comunicarse con nosotros, puede optar por buscar atencin mdica  en el consultorio de su doctor(a), en una clnica privada, en un centro de atencin urgente o en una sala de emergencias.  Si tiene engineer, drilling, por favor llame inmediatamente al 911 o vaya a la sala de  emergencias.  Nmeros de bper  - Dr. Hester: [phone]  - Dra. Jackquline: [phone]  - Dr. Claudene: [phone]  - Dra. Kitts: [phone]  En caso de inclemencias del [place], por favor llame a nuestra lnea principal al [phone] para una actualizacin sobre el estado de cualquier retraso o cierre.  Consejos para la medicacin en dermatologa: Por favor, guarde las cajas en las que vienen los medicamentos de uso tpico para ayudarle a seguir las instrucciones sobre dnde y cmo usarlos. Las farmacias generalmente imprimen las instrucciones del medicamento slo en las cajas y no directamente en los tubos del [place].   Si su medicamento es muy caro, por favor, pngase en contacto con landry rieger llamando al [phone] y presione la opcin 4 o envenos un mensaje a travs de Clinical Cytogeneticist.   No podemos decirle cul ser su copago por los medicamentos por adelantado ya que esto es diferente dependiendo de la cobertura de su seguro. Sin embargo, es  posible que podamos encontrar un medicamento sustituto a audiological scientist un formulario para que el seguro cubra el medicamento que se considera necesario.   Si se requiere una autorizacin previa para que su compaa de seguros cubra su medicamento, por favor permtanos de 1 a 2 das hbiles para completar este proceso.  Los precios de los medicamentos varan con frecuencia dependiendo del environmental consultant de dnde se surte la receta y alguna farmacias pueden ofrecer precios ms baratos.  El sitio web www.goodrx.com tiene cupones para medicamentos de health and safety inspector. Los precios aqu no tienen en cuenta lo que podra costar con la ayuda del seguro (puede ser ms barato con su seguro), pero el sitio web puede darle el precio si no utiliz tourist information centre manager.  - Puede imprimir el cupn correspondiente y llevarlo con su receta a la farmacia.  - Tambin puede pasar por nuestra oficina durante el horario de atencin regular y education officer, museum una tarjeta  de cupones de GoodRx.  - Si necesita que su receta se enve electrnicamente a una farmacia diferente, informe a nuestra oficina a travs de MyChart de  o por telfono llamando al [phone] y presione la opcin 4.

## 2024-03-23 ENCOUNTER — Ambulatory Visit: Payer: Self-pay | Admitting: Sleep Medicine

## 2024-03-30 ENCOUNTER — Ambulatory Visit: Admitting: Sleep Medicine

## 2024-04-19 ENCOUNTER — Ambulatory Visit: Admitting: Sleep Medicine

## 2024-04-25 ENCOUNTER — Ambulatory Visit

## 2024-05-29 ENCOUNTER — Other Ambulatory Visit

## 2024-09-18 ENCOUNTER — Ambulatory Visit: Admitting: Dermatology
